# Patient Record
Sex: Male | Born: 1995 | Race: Black or African American | Hispanic: No | Marital: Single
Health system: Midwestern US, Community
[De-identification: ages and names within clinical notes are randomized; demographics above are authoritative.]

## PROBLEM LIST (undated history)

## (undated) DIAGNOSIS — F489 Nonpsychotic mental disorder, unspecified: Secondary | ICD-10-CM

---

## 2015-04-03 ENCOUNTER — Emergency Department (INDEPENDENT_AMBULATORY_CARE_PROVIDER_SITE_OTHER): Payer: Medicaid - Out of State

## 2015-04-03 ENCOUNTER — Emergency Department (INDEPENDENT_AMBULATORY_CARE_PROVIDER_SITE_OTHER)
Admission: EM | Admit: 2015-04-03 | Discharge: 2015-04-03 | Disposition: A | Discharge status: Home / Self Care | Payer: Medicaid - Out of State | Source: Home / Self Care | Attending: Family Medicine | Admitting: Family Medicine

## 2015-04-03 ENCOUNTER — Encounter (HOSPITAL_COMMUNITY): Payer: Self-pay | Admitting: *Deleted

## 2015-04-03 DIAGNOSIS — S82841A Displaced bimalleolar fracture of right lower leg, initial encounter for closed fracture: Secondary | ICD-10-CM

## 2015-04-03 MED ORDER — HYDROCODONE-ACETAMINOPHEN 5-325 MG PO TABS: 1.0000 | ORAL_TABLET | Freq: Four times a day (QID) | ORAL | Status: AC | PRN

## 2015-04-03 NOTE — ED Provider Notes (Signed)
CSN: [id number]     Arrival date & time 04/03/15  1305 History   First MD Initiated Contact with Patient 04/03/15 1344     Chief Complaint  Patient presents with  . Ankle Pain   (Consider location/radiation/quality/duration/timing/severity/associated sxs/prior Treatment) Patient is a 19 y.o. male presenting with ankle pain. The history is provided by the patient.  Ankle Pain Location:  Ankle Time since incident:  1 day Injury: yes   Mechanism of injury comment:  Injured yest playing bball, continues with lat pain and sts. Ankle location:  R ankle Pain details:    Radiates to:  Does not radiate   Severity:  Moderate   Progression:  Unchanged Chronicity:  New Dislocation: no   Foreign body present:  No foreign bodies Prior injury to area:  No Relieved by:  None tried   History reviewed. No pertinent past medical history. History reviewed. No pertinent past surgical history. History reviewed. No pertinent family history. Social History  Substance Use Topics  . Smoking status: Never Smoker   . Smokeless tobacco: None  . Alcohol Use: No    Review of Systems  Musculoskeletal: Positive for joint swelling and gait problem.  Skin: Negative.   All other systems reviewed and are negative.   Allergies  Review of patient's allergies indicates no known allergies.  Home Medications   Prior to Admission medications   Medication Sig Start Date End Date Taking? Authorizing Provider  HYDROcodone-acetaminophen (NORCO/VICODIN) 5-325 MG tablet Take 1 tablet by mouth every 6 (six) hours as needed. For pain 04/03/15   Linna Hoff, MD   Meds Ordered and Administered this Visit  Medications - No data to display  BP 114/78 mmHg  Pulse 67  Temp(Src) 98.3 F (36.8 C) (Oral)  Resp 16  SpO2 99% No data found.   Physical Exam  Constitutional: He is oriented to person, place, and time. He appears well-developed and well-nourished.  Musculoskeletal: He exhibits tenderness.   Right ankle: He exhibits decreased range of motion, swelling and deformity. He exhibits normal pulse. Tenderness. Lateral malleolus, AITFL and CF ligament tenderness found. No medial malleolus, no head of 5th metatarsal and no proximal fibula tenderness found. Achilles tendon normal.  Neurological: He is alert and oriented to person, place, and time.  Skin: Skin is warm and dry.  Nursing note and vitals reviewed.   ED Course  Procedures (including critical care time)  Labs Review Labs Reviewed - No data to display  Imaging Review Dg Ankle Complete Right  04/03/2015   CLINICAL DATA:  Ankle injury 1 day ago.  EXAM: RIGHT ANKLE - COMPLETE 3+ VIEW  COMPARISON:  None.  FINDINGS: There is a comminuted oblique fracture through the medial malleolus with intra-articular extension. Ankle mortise otherwise appears located. Linear calcific fragments are seen within the ankle mortise. There is a calcific fragment within the anterior lateral soft tissues of the ankle, with uncertain donor site. There is extensive soft tissue swelling.  IMPRESSION: Comminuted intra-articular fracture of the medial malleolus.  Linear calcific densities within ankle mortise, and a calcific fragment within the anterior lateral soft tissues with uncertain donor site.   Electronically Signed   By: Ted Mcalpine M.D.   On: 04/03/2015 14:16   X-rays reviewed and report per radiologist.   Visual Acuity Review  Right Eye Distance:   Left Eye Distance:   Bilateral Distance:    Right Eye Near:   Left Eye Near:    Bilateral Near:  MDM   1. Ankle fracture, bimalleolar, closed, right, initial encounter    Discussed with dr Lajoyce Corners, will see 24-48h, ice and elevate    Linna Hoff, MD 04/03/15 1446

## 2015-04-03 NOTE — Discharge Instructions (Signed)
Ice, elevate, use crutches and see dr duda in 1-2 days for recheck., you must keep elevated as much as possible.

## 2015-04-03 NOTE — ED Notes (Signed)
Pt  rports  Pain /   Swelling  To  The  Affected r  Ankle   He  States  He  Injured  It  Playing  [employer]              He  Has  Pain  And  Swelling  Present

## 2015-04-03 NOTE — Progress Notes (Signed)
Orthopedic Tech Progress Note Patient Details:  Dylan Adams January 17, 1996 [id number] Applied fiberglass posterior short leg splint and fiberglass stirrup splint to RLE.  Pulses, sensation motion intact before and after splinting.  Capillary refill less than 2 seconds before and after splinting.  Fit pt. for crutches and taught use of same. Ortho Devices Type of Ortho Device: Stirrup splint, Post (short leg) splint, Crutches Ortho Device/Splint Location: RLE Ortho Device/Splint Interventions: Application   [name] 04/03/2015, 4:11 PM

## 2015-04-05 ENCOUNTER — Other Ambulatory Visit: Payer: Self-pay | Admitting: Plastic Surgery

## 2015-04-06 ENCOUNTER — Other Ambulatory Visit: Payer: Self-pay | Admitting: Pediatrics

## 2015-04-06 DIAGNOSIS — M25571 Pain in right ankle and joints of right foot: Secondary | ICD-10-CM

## 2015-04-07 ENCOUNTER — Ambulatory Visit
Admission: RE | Admit: 2015-04-07 | Discharge: 2015-04-07 | Disposition: A | Discharge status: Home / Self Care | Payer: Medicaid - Out of State | Source: Ambulatory Visit | Attending: Pediatrics | Admitting: Pediatrics

## 2015-04-07 DIAGNOSIS — M25571 Pain in right ankle and joints of right foot: Secondary | ICD-10-CM

## 2015-10-10 ENCOUNTER — Encounter (HOSPITAL_COMMUNITY): Payer: Self-pay | Admitting: Emergency Medicine

## 2015-10-10 ENCOUNTER — Ambulatory Visit (HOSPITAL_COMMUNITY)
Admission: EM | Admit: 2015-10-10 | Discharge: 2015-10-10 | Disposition: A | Discharge status: Home / Self Care | Payer: Managed Care, Other (non HMO) | Attending: Family Medicine | Admitting: Family Medicine

## 2015-10-10 DIAGNOSIS — R69 Illness, unspecified: Principal | ICD-10-CM

## 2015-10-10 DIAGNOSIS — J111 Influenza due to unidentified influenza virus with other respiratory manifestations: Secondary | ICD-10-CM

## 2015-10-10 NOTE — ED Notes (Signed)
[facility] a complaint of a cough, chills, and a headache x 1 week.

## 2015-10-10 NOTE — Discharge Instructions (Signed)
Drink plenty of fluids as discussed, treat aching as needed and mucinex or delsym for cough. Return or see your doctor if further problems

## 2015-10-10 NOTE — ED Provider Notes (Signed)
CSN: [id number]     Arrival date & time 10/10/15  1822 History   First MD Initiated Contact with Patient 10/10/15 1934     Chief Complaint  Patient presents with  . Headache  . Chills  . Cough   (Consider location/radiation/quality/duration/timing/severity/associated sxs/prior Treatment) Patient is a 20 y.o. male presenting with headaches and cough. The history is provided by the patient.  Headache Pain location:  Generalized Quality:  Dull Radiates to:  Does not radiate Onset quality:  Gradual Duration:  1 week Progression:  Unchanged Chronicity:  New Similar to prior headaches: no   Relieved by:  None tried Associated symptoms: cough, drainage, myalgias and URI   Associated symptoms: no abdominal pain, no back pain, no fever, no nausea, no sore throat, no swollen glands and no vomiting   Cough Associated symptoms: headaches and myalgias   Associated symptoms: no fever and no sore throat     History reviewed. No pertinent past medical history. History reviewed. No pertinent past surgical history. History reviewed. No pertinent family history. Social History  Substance Use Topics  . Smoking status: Never Smoker   . Smokeless tobacco: None  . Alcohol Use: No    Review of Systems  Constitutional: Negative.  Negative for fever.  HENT: Positive for postnasal drip. Negative for sore throat.   Respiratory: Positive for cough.   Cardiovascular: Negative.   Gastrointestinal: Negative.  Negative for nausea, vomiting and abdominal pain.  Genitourinary: Negative.   Musculoskeletal: Positive for myalgias. Negative for back pain.  Neurological: Positive for headaches.  All other systems reviewed and are negative.   Allergies  Review of patient's allergies indicates no known allergies.  Home Medications   Prior to Admission medications   Medication Sig Start Date End Date Taking? Authorizing Provider  HYDROcodone-acetaminophen (NORCO/VICODIN) 5-325 MG tablet Take 1 tablet  by mouth every 6 (six) hours as needed. For pain 04/03/15   Linna HoffJames D Asriel Westrup, MD   Meds Ordered and Administered this Visit  Medications - No data to display  BP 119/79 mmHg  Pulse 75  Temp(Src) 98.2 F (36.8 C) (Oral)  SpO2 98% No data found.   Physical Exam  Constitutional: He is oriented to person, place, and time. He appears well-developed and well-nourished.  HENT:  Right Ear: External ear normal.  Left Ear: External ear normal.  Mouth/Throat: Oropharynx is clear and moist.  Eyes: Pupils are equal, round, and reactive to light.  Neck: Normal range of motion. Neck supple.  Cardiovascular: Normal rate, normal heart sounds and intact distal pulses.   Pulmonary/Chest: Effort normal.  Abdominal: Soft. Bowel sounds are normal. There is no tenderness.  Lymphadenopathy:    He has no cervical adenopathy.  Neurological: He is alert and oriented to person, place, and time.  Skin: Skin is warm and dry.  Nursing note and vitals reviewed.   ED Course  Procedures (including critical care time)  Labs Review Labs Reviewed - No data to display  Imaging Review No results found.   Visual Acuity Review  Right Eye Distance:   Left Eye Distance:   Bilateral Distance:    Right Eye Near:   Left Eye Near:    Bilateral Near:         MDM  No diagnosis found.    Linna HoffJames D Davan Hark, MD 10/10/15 2004

## 2016-06-17 ENCOUNTER — Telehealth (INDEPENDENT_AMBULATORY_CARE_PROVIDER_SITE_OTHER): Payer: Self-pay | Admitting: Orthopaedic Surgery

## 2016-06-18 ENCOUNTER — Ambulatory Visit (INDEPENDENT_AMBULATORY_CARE_PROVIDER_SITE_OTHER): Payer: Self-pay | Admitting: Orthopaedic Surgery

## 2016-06-18 NOTE — Telephone Encounter (Signed)
Patient called again today asking about a refill of oxycodone, he is having ankle pain.  He was last seen 07/2015.  I advised him I did not know if a refill could be given as he was last seen so long ago.  He called yesterday and s/w Kathie RhodesBetty.  Pls call pt at [phone], thanks.

## 2016-06-19 NOTE — Telephone Encounter (Signed)
Could you please call and tell him he needs to make an appointment, he was a no show on Tuesday 06/18/16. Thanks!!

## 2016-06-19 NOTE — Telephone Encounter (Signed)
Please advise 

## 2016-06-21 NOTE — Telephone Encounter (Signed)
Patient is now living in [place] and will see a physician there.

## 2016-08-15 ENCOUNTER — Inpatient Hospital Stay
Admit: 2016-08-15 | Discharge: 2016-08-15 | Disposition: A | Discharge status: Home / Self Care | Payer: PRIVATE HEALTH INSURANCE | Attending: Emergency Medicine

## 2016-08-15 DIAGNOSIS — Z0389 Encounter for observation for other suspected diseases and conditions ruled out: Secondary | ICD-10-CM

## 2016-08-15 LAB — METABOLIC PANEL, BASIC
Anion gap: 4 mmol/L (ref 3.0–18)
BUN/Creatinine ratio: 14 (ref 12–20)
BUN: 13 MG/DL (ref 7.0–18)
CO2: 31 mmol/L (ref 21–32)
Calcium: 9.7 MG/DL (ref 8.5–10.1)
Chloride: 102 mmol/L (ref 100–108)
Creatinine: 0.96 MG/DL (ref 0.6–1.3)
GFR est AA: >60 mL/min/{1.73_m2} (ref >60)
GFR est non-AA: >60 mL/min/{1.73_m2} (ref >60)
Glucose: 89 mg/dL (ref 74–99)
Potassium: 3.8 mmol/L (ref 3.5–5.5)
Sodium: 137 mmol/L (ref 136–145)

## 2016-08-15 LAB — CBC WITH AUTOMATED DIFF
ABS. BASOPHILS: 0 10*3/uL (ref 0.0–0.1)
ABS. EOSINOPHILS: 0.1 10*3/uL (ref 0.0–0.4)
ABS. LYMPHOCYTES: 2.6 10*3/uL (ref 0.9–3.6)
ABS. MONOCYTES: 0.5 10*3/uL (ref 0.05–1.2)
ABS. NEUTROPHILS: 2.8 10*3/uL (ref 1.8–8.0)
BASOPHILS: 0 % (ref 0–2)
EOSINOPHILS: 1 % (ref 0–5)
HCT: 45.9 % (ref 36.0–48.0)
HGB: 16.3 g/dL — ABNORMAL HIGH (ref 13.0–16.0)
LYMPHOCYTES: 44 % (ref 21–52)
MCH: 29.9 PG (ref 24.0–34.0)
MCHC: 35.5 g/dL (ref 31.0–37.0)
MCV: 84.2 FL (ref 74.0–97.0)
MONOCYTES: 8 % (ref 3–10)
MPV: 9.9 FL (ref 9.2–11.8)
NEUTROPHILS: 47 % (ref 40–73)
PLATELET: 210 10*3/uL (ref 135–420)
RBC: 5.45 M/uL (ref 4.70–5.50)
RDW: 12.3 % (ref 11.6–14.5)
WBC: 6 10*3/uL (ref 4.6–13.2)

## 2016-08-15 LAB — DRUG SCREEN, URINE
AMPHETAMINES: NEGATIVE
BARBITURATES: NEGATIVE
BENZODIAZEPINES: NEGATIVE
COCAINE: NEGATIVE
METHADONE: NEGATIVE
OPIATES: NEGATIVE
PCP(PHENCYCLIDINE): NEGATIVE
THC (TH-CANNABINOL): POSITIVE — AB

## 2016-08-15 LAB — TSH 3RD GENERATION: TSH: 1.67 u[IU]/mL (ref 0.36–3.74)

## 2016-08-15 LAB — ETHYL ALCOHOL: ALCOHOL(ETHYL),SERUM: <3 MG/DL (ref 0–3)

## 2016-08-15 NOTE — ED Triage Notes (Addendum)
Pt states was sent here by PCP for mental health evaluation currently denies SI and HI cooperative with ED staff

## 2016-08-15 NOTE — ED Provider Notes (Signed)
EMERGENCY DEPARTMENT HISTORY AND PHYSICAL EXAM    2:33 PM      Date: 08/15/2016  Harry Nelson Name: [facility]    History of Presenting Illness     Chief Complaint   Harry Nelson presents with   ??? Mental Health Problem         History Provided By: Harry Nelson and Patients doctor    Chief Complaint: Mental health evaluation  Duration:  Prior to arrival  Timing:  Acute  Location: N/A  Quality: N/A  Severity: Moderate  Modifying Factors: N/A  Associated Symptoms: denies any other associated signs or symptoms      Additional History (Context): Harry Nelson is a 21 y.o. male who presents to ED for moderate acute mental health evaluation per phone call received from patients doctor prior to arrival. Per PCP, Harry Nelson stated that he felt like someone was watching him and that it was a chip in his ankle in which the government uses to monitor him. Denies any sx or saying the above stated at the doctors visit. States he's here for blood work but doesn't know why.      As the Harry Nelson is without physical symptoms or complaints of pain, there is no severity of pain, quality of pain, duration, modifying factors, or associated signs and symptoms regarding the pt's presenting complaint.    PCP: Rosealee Albee, MD        Past History     Past Medical History:  History reviewed. No pertinent past medical history.    Past Surgical History:  History reviewed. No pertinent surgical history.    Family History:  History reviewed. No pertinent family history.    Social History:  Social History   Substance Use Topics   ??? Smoking status: Never Smoker   ??? Smokeless tobacco: Never Used   ??? Alcohol use None       Allergies:  No Known Allergies      Review of Systems       Review of Systems   Constitutional: Negative for chills and fever.   HENT: Negative for rhinorrhea.    Cardiovascular: Negative for chest pain.   Gastrointestinal: Negative for abdominal pain, nausea and vomiting.   Endocrine: Negative for polyuria.    Genitourinary: Negative for dysuria.   Musculoskeletal: Negative for back pain.   Skin: Negative for rash.   Neurological: Positive for syncope. Negative for headaches.   Psychiatric/Behavioral: Negative for hallucinations.   All other systems reviewed and are negative.        Physical Exam     Harry Nelson Vitals for the past 12 hrs:   Temp Pulse Resp BP SpO2   08/15/16 1449 - - - - 99 %   08/15/16 1331 98 ??F (36.7 ??C) 95 18 (!) 140/91 99 %         Physical Exam   Constitutional: He is oriented to person, place, and time. He appears well-developed and well-nourished.   Speaking in full sentences   HENT:   Head: Normocephalic and atraumatic.   Eyes: Conjunctivae are normal. Pupils are equal, round, and reactive to light.   Neck: Normal range of motion. Neck supple.   Cardiovascular: Normal rate and regular rhythm.    Pulmonary/Chest: Effort normal and breath sounds normal. No respiratory distress. He has no wheezes.   Abdominal: Soft. Bowel sounds are normal. He exhibits no distension. There is no tenderness. There is no rebound and no guarding.   Musculoskeletal: Normal range of motion.   Neurological:  He is alert and oriented to person, place, and time.   Skin: Skin is warm and dry.   Psychiatric: He has a normal mood and affect. Thought content normal.   Nursing note and vitals reviewed.        Diagnostic Study Results     Labs -  Recent Results (from the past 12 hour(s))   DRUG SCREEN, URINE    Collection Time: 08/15/16  1:39 PM   Result Value Ref Range    BENZODIAZEPINES NEGATIVE  NEG      BARBITURATES NEGATIVE  NEG      THC (TH-CANNABINOL) POSITIVE (A) NEG      OPIATES NEGATIVE  NEG      PCP(PHENCYCLIDINE) NEGATIVE  NEG      COCAINE NEGATIVE  NEG      AMPHETAMINES NEGATIVE  NEG      METHADONE NEGATIVE  NEG      HDSCOM (NOTE)    CBC WITH AUTOMATED DIFF    Collection Time: 08/15/16  2:46 PM   Result Value Ref Range    WBC 6.0 4.6 - 13.2 K/uL    RBC 5.45 4.70 - 5.50 M/uL    HGB 16.3 (H) 13.0 - 16.0 g/dL     HCT 16.145.9 09.636.0 - 04.548.0 %    MCV 84.2 74.0 - 97.0 FL    MCH 29.9 24.0 - 34.0 PG    MCHC 35.5 31.0 - 37.0 g/dL    RDW 40.912.3 81.111.6 - 91.414.5 %    PLATELET 210 135 - 420 K/uL    MPV 9.9 9.2 - 11.8 FL    NEUTROPHILS 47 40 - 73 %    LYMPHOCYTES 44 21 - 52 %    MONOCYTES 8 3 - 10 %    EOSINOPHILS 1 0 - 5 %    BASOPHILS 0 0 - 2 %    ABS. NEUTROPHILS 2.8 1.8 - 8.0 K/UL    ABS. LYMPHOCYTES 2.6 0.9 - 3.6 K/UL    ABS. MONOCYTES 0.5 0.05 - 1.2 K/UL    ABS. EOSINOPHILS 0.1 0.0 - 0.4 K/UL    ABS. BASOPHILS 0.0 0.0 - 0.1 K/UL    DF AUTOMATED     METABOLIC PANEL, BASIC    Collection Time: 08/15/16  2:46 PM   Result Value Ref Range    Sodium 137 136 - 145 mmol/L    Potassium 3.8 3.5 - 5.5 mmol/L    Chloride 102 100 - 108 mmol/L    CO2 31 21 - 32 mmol/L    Anion gap 4 3.0 - 18 mmol/L    Glucose 89 74 - 99 mg/dL    BUN 13 7.0 - 18 MG/DL    Creatinine 7.820.96 0.6 - 1.3 MG/DL    BUN/Creatinine ratio 14 12 - 20      GFR est AA >60 >60 ml/min/1.6373m2    GFR est non-AA >60 >60 ml/min/1.7473m2    Calcium 9.7 8.5 - 10.1 MG/DL   ETHYL ALCOHOL    Collection Time: 08/15/16  2:46 PM   Result Value Ref Range    ALCOHOL(ETHYL),SERUM <3 0 - 3 MG/DL   TSH 3RD GENERATION    Collection Time: 08/15/16  2:46 PM   Result Value Ref Range    TSH 1.67 0.36 - 3.74 uIU/mL       Radiologic Studies -   No results found.      Medical Decision Making   I am the first provider for this Harry Nelson.    I  reviewed the vital signs, available nursing notes, past medical history, past surgical history, family history and social history.    Vital Signs-Reviewed the Harry Nelson's vital signs.    Pulse Oximetry Analysis -  99% on room air , Normal      Records Reviewed: Nursing Notes and Old Medical Records (Time of Review: 2:33 PM)    Provider Notes (Medical Decision Making): Harry Nelson is denying he made any statements at the PCP office. Discussed with crisis and Harry Nelson doesn't not meet admission critieria if he is currently denying everything and has  no SI or HI which he doesn't. Discussed with PCPwho will follow-up this week.     ED Course: Progress Notes, Reevaluation, and Consults:  4:19 PM  Talked to crisis about the Harry Nelson. Harry Nelson is asking to leave, Crisis says that if he is currently denying anything that they previously reported at the office then he is free to go.     Consult:  Discussed care with associate Dr. Helen Hashimoto, Specialty: M.D. Standard discussion; including history of Harry Nelson???s chief complaint, available diagnostic results, and treatment course. Crisis states that if he denied everything then they agreed that there is nothing that we can do and they will follow up with Harry Nelson this week  5:19 PM, 08/15/2016       Diagnosis     Clinical Impression:   1. Normal physical exam        Disposition: Discharge    Follow-up Information     Follow up With Details Comments Contact Info    Rosealee Albee, MD In 1 day  [address]  [place] [address]  [phone]             There are no discharge medications for this Harry Nelson.    _______________________________    Attestations:  Scribe Attestation     Berneta Sages and Mable Fill acting as a scribe for and in the presence of Winn Jock, MD      August 15, 2016 at 2:33 PM       Provider Attestation:      I personally performed the services described in the documentation, reviewed the documentation, as recorded by the scribe in my presence, and it accurately and completely records my words and actions. August 15, 2016 at 2:33 PM - Winn Jock, MD    _______________________________

## 2016-08-15 NOTE — ED Notes (Signed)
Patient states he needs to go home to check on his grandmother. MD notified.

## 2016-08-15 NOTE — ED Notes (Cosign Needed)
I performed a brief evaluation, including history and physical, of the patient here in triage and I have determined that pt will need further treatment and evaluation from the main side ER physician.  I have placed initial orders to help in expediting patients care.     August 15, 2016 at 1:37 PM - Clayton BiblesSunny A Torrance Frech, PA-C        Visit Vitals   ??? Pulse 95   ??? Temp 98 ??F (36.7 ??C)   ??? Resp 18   ??? SpO2 99%

## 2016-08-15 NOTE — ED Notes (Signed)
Patient was given d/c papers and reassured to return for evaluation.

## 2016-08-15 NOTE — ED Notes (Signed)
Patient is resting in bed. He has no requests at this time.

## 2016-08-17 ENCOUNTER — Inpatient Hospital Stay
Admit: 2016-08-17 | Discharge: 2016-08-23 | Disposition: A | Discharge status: Home / Self Care | Payer: PRIVATE HEALTH INSURANCE | Attending: Psychiatry | Admitting: Psychiatry

## 2016-08-17 DIAGNOSIS — F12159 Cannabis abuse with psychotic disorder, unspecified: Secondary | ICD-10-CM

## 2016-08-17 LAB — METABOLIC PANEL, COMPREHENSIVE
A-G Ratio: 1 (ref 0.8–1.7)
ALT (SGPT): 21 U/L (ref 16–61)
AST (SGOT): 21 U/L (ref 15–37)
Albumin: 4.3 g/dL (ref 3.4–5.0)
Alk. phosphatase: 115 U/L (ref 45–117)
Anion gap: 7 mmol/L (ref 3.0–18)
BUN/Creatinine ratio: 17 (ref 12–20)
BUN: 18 MG/DL (ref 7.0–18)
Bilirubin, total: 0.6 MG/DL (ref 0.2–1.0)
CO2: 27 mmol/L (ref 21–32)
Calcium: 9.3 MG/DL (ref 8.5–10.1)
Chloride: 105 mmol/L (ref 100–108)
Creatinine: 1.05 MG/DL (ref 0.6–1.3)
GFR est AA: >60 mL/min/{1.73_m2} (ref >60)
GFR est non-AA: >60 mL/min/{1.73_m2} (ref >60)
Globulin: 4.1 g/dL — ABNORMAL HIGH (ref 2.0–4.0)
Glucose: 93 mg/dL (ref 74–99)
Potassium: 3.6 mmol/L (ref 3.5–5.5)
Protein, total: 8.4 g/dL — ABNORMAL HIGH (ref 6.4–8.2)
Sodium: 139 mmol/L (ref 136–145)

## 2016-08-17 LAB — CBC WITH AUTOMATED DIFF
ABS. BASOPHILS: 0 10*3/uL (ref 0.0–0.1)
ABS. EOSINOPHILS: 0.1 10*3/uL (ref 0.0–0.4)
ABS. LYMPHOCYTES: 2.8 10*3/uL (ref 0.9–3.6)
ABS. MONOCYTES: 0.7 10*3/uL (ref 0.05–1.2)
ABS. NEUTROPHILS: 3.6 10*3/uL (ref 1.8–8.0)
BASOPHILS: 0 % (ref 0–2)
EOSINOPHILS: 1 % (ref 0–5)
HCT: 44.3 % (ref 36.0–48.0)
HGB: 15.5 g/dL (ref 13.0–16.0)
LYMPHOCYTES: 39 % (ref 21–52)
MCH: 29.4 PG (ref 24.0–34.0)
MCHC: 35 g/dL (ref 31.0–37.0)
MCV: 83.9 FL (ref 74.0–97.0)
MONOCYTES: 10 % (ref 3–10)
MPV: 10.2 FL (ref 9.2–11.8)
NEUTROPHILS: 50 % (ref 40–73)
PLATELET: 224 10*3/uL (ref 135–420)
RBC: 5.28 M/uL (ref 4.70–5.50)
RDW: 12.4 % (ref 11.6–14.5)
WBC: 7.2 10*3/uL (ref 4.6–13.2)

## 2016-08-17 LAB — DRUG SCREEN, URINE
AMPHETAMINES: NEGATIVE
BARBITURATES: NEGATIVE
BENZODIAZEPINES: NEGATIVE
COCAINE: NEGATIVE
METHADONE: NEGATIVE
OPIATES: NEGATIVE
PCP(PHENCYCLIDINE): NEGATIVE
THC (TH-CANNABINOL): POSITIVE — AB

## 2016-08-17 LAB — ETHYL ALCOHOL: ALCOHOL(ETHYL),SERUM: <3 MG/DL (ref 0–3)

## 2016-08-17 NOTE — ED Notes (Signed)
Report called to Tammise RN on behavioral unit. Will escort patient shortly.

## 2016-08-17 NOTE — ED Triage Notes (Signed)
Patient is here to get help  With drinking and drugs. Patient's uncle brought him here. Patient states he smokes mariajuana denies drinking alcohol.

## 2016-08-17 NOTE — ED Notes (Signed)
Crisis worker at bedside

## 2016-08-17 NOTE — ED Notes (Signed)
Awaiting bed placement. No ss distress. Family at bedside. Will continue to monitor closely while awaiting further orders.

## 2016-08-17 NOTE — ED Notes (Signed)
Report given to Jennifer RN. No questions or concerns after report

## 2016-08-17 NOTE — ED Provider Notes (Signed)
EMERGENCY DEPARTMENT HISTORY AND PHYSICAL EXAM    2:20 PM      Date: 08/17/2016  Patient Name: Harry Nelson    History of Presenting Illness     Chief Complaint   Patient presents with   ??? Mental Health Problem     drugs and alcohol         History Provided By: Patient, Patient's Father and Patient's uncle    Chief Complaint: Mental health  Duration:  1 day ago  Timing:  Acute  Severity: Severe  Modifying Factors: pt denies drinking or taking anything more than weed but his uncle and father believe he did more than that.  Associated Symptoms: Denies HI, SI, and auditory halucinations      Additional History (Context): Harry Nelson is a 21 y.o. male with No significant past medical history who presents with severe acute mental health problems onset 1 day ago.  Pt denies drinking or taking anything more than weed but his uncle and father believe he did more than that. Pt has be exibiting abnormal behavior and paranoia. Denies HI, SI, and auditory hallucinations. Pt has a shx of alcohol use but no shx of tobacco use.    PCP: Rufina Falco, MD        Past History     Past Medical History:  No past medical history on file.    Past Surgical History:  No past surgical history on file.    Family History:  No family history on file.    Social History:  Social History   Substance Use Topics   ??? Smoking status: Never Smoker   ??? Smokeless tobacco: Never Used   ??? Alcohol use Not on file       Allergies:  No Known Allergies      Review of Systems   Review of Systems   Constitutional: Negative for fever.   Psychiatric/Behavioral: Negative for hallucinations, self-injury and suicidal ideas.   All other systems reviewed and are negative.        Physical Exam     Visit Vitals   ??? BP 127/79 (BP 1 Location: Left arm, BP Patient Position: At rest)   ??? Pulse 86   ??? Temp 98.2 ??F (36.8 ??C)   ??? Resp 17   ??? SpO2 100%         Physical Exam   Constitutional: He is oriented to person, place, and time. He appears  well-developed and well-nourished.   HENT:   Head: Normocephalic and atraumatic.   Neck: Neck supple. No JVD present.   Cardiovascular: Normal rate and regular rhythm.    Pulmonary/Chest: Effort normal and breath sounds normal. No respiratory distress.   Abdominal: Soft. He exhibits no distension. There is no tenderness. There is no rebound and no guarding.   Musculoskeletal: He exhibits no edema.   Neurological: He is alert and oriented to person, place, and time.   Skin: Skin is warm and dry. No erythema.   Psychiatric:   Flat affect  Poor eye contact  Denies SI, HI or hallucinations         Diagnostic Study Results     Labs -  Recent Results (from the past 12 hour(s))   DRUG SCREEN, URINE    Collection Time: 08/17/16  1:31 PM   Result Value Ref Range    BENZODIAZEPINES NEGATIVE  NEG      BARBITURATES NEGATIVE  NEG      THC (TH-CANNABINOL) POSITIVE (A) NEG  OPIATES NEGATIVE  NEG      PCP(PHENCYCLIDINE) NEGATIVE  NEG      COCAINE NEGATIVE  NEG      AMPHETAMINES NEGATIVE  NEG      METHADONE NEGATIVE  NEG      HDSCOM (NOTE)    ETHYL ALCOHOL    Collection Time: 08/17/16  1:38 PM   Result Value Ref Range    ALCOHOL(ETHYL),SERUM <3 0 - 3 MG/DL   CBC WITH AUTOMATED DIFF    Collection Time: 08/17/16  1:38 PM   Result Value Ref Range    WBC 7.2 4.6 - 13.2 K/uL    RBC 5.28 4.70 - 5.50 M/uL    HGB 15.5 13.0 - 16.0 g/dL    HCT 44.3 36.0 - 48.0 %    MCV 83.9 74.0 - 97.0 FL    MCH 29.4 24.0 - 34.0 PG    MCHC 35.0 31.0 - 37.0 g/dL    RDW 12.4 11.6 - 14.5 %    PLATELET 224 135 - 420 K/uL    MPV 10.2 9.2 - 11.8 FL    NEUTROPHILS 50 40 - 73 %    LYMPHOCYTES 39 21 - 52 %    MONOCYTES 10 3 - 10 %    EOSINOPHILS 1 0 - 5 %    BASOPHILS 0 0 - 2 %    ABS. NEUTROPHILS 3.6 1.8 - 8.0 K/UL    ABS. LYMPHOCYTES 2.8 0.9 - 3.6 K/UL    ABS. MONOCYTES 0.7 0.05 - 1.2 K/UL    ABS. EOSINOPHILS 0.1 0.0 - 0.4 K/UL    ABS. BASOPHILS 0.0 0.0 - 0.1 K/UL    DF AUTOMATED     METABOLIC PANEL, COMPREHENSIVE    Collection Time: 08/17/16  1:38 PM    Result Value Ref Range    Sodium 139 136 - 145 mmol/L    Potassium 3.6 3.5 - 5.5 mmol/L    Chloride 105 100 - 108 mmol/L    CO2 27 21 - 32 mmol/L    Anion gap 7 3.0 - 18 mmol/L    Glucose 93 74 - 99 mg/dL    BUN 18 7.0 - 18 MG/DL    Creatinine 1.05 0.6 - 1.3 MG/DL    BUN/Creatinine ratio 17 12 - 20      GFR est AA >60 >60 ml/min/1.59m    GFR est non-AA >60 >60 ml/min/1.754m   Calcium 9.3 8.5 - 10.1 MG/DL    Bilirubin, total 0.6 0.2 - 1.0 MG/DL    ALT (SGPT) 21 16 - 61 U/L    AST (SGOT) 21 15 - 37 U/L    Alk. phosphatase 115 45 - 117 U/L    Protein, total 8.4 (H) 6.4 - 8.2 g/dL    Albumin 4.3 3.4 - 5.0 g/dL    Globulin 4.1 (H) 2.0 - 4.0 g/dL    A-G Ratio 1.0 0.8 - 1.7         Radiologic Studies -   No orders to display         Medical Decision Making   I am the first provider for this patient.    I reviewed the vital signs, available nursing notes, past medical history, past surgical history, family history and social history.    Vital Signs-Reviewed the patient's vital signs.    Records Reviewed: Nursing Notes (Time of Review: 2:20 PM)    ED Course: Progress Notes, Reevaluation, and Consults:  3:05 PM Consult: I discussed care with DoButch Penny  Crisis).  It was a standard discussion including patient history, chief complaint, available diagnostic results, and predicted treatment course.  Will eval pt.    5:59 PM  Pt seen by Butch Penny, she will plan on admission, pt is currently voluntary.     7 PM : Pt care transferred to Dr. Gunnar Fusi  ,ED provider. History of patient complaint(s), available diagnostic reports and current treatment plan has been discussed thoroughly.   Bedside rounding on patient occured : yes .  Intended disposition of patient : ADMIT  Pending diagnostics reports and/or labs (please list):           Provider Notes (Medical Decision Making): 21 y/o male presents for psych eval. Pt with paranoia. Recent visit, will consult crisis. Denies SI or HI.       Diagnosis     Clinical Impression:    1. Paranoia ([place])        Disposition:     Follow-up Information     None           Patient's Medications    No medications on file     _______________________________    Attestations:  Scribe [place] acting as a scribe for and in the presence of Lamonte Sakai, DO      August 17, 2016 at 2:20 PM       Provider Attestation:      I personally performed the services described in the documentation, reviewed the documentation, as recorded by the scribe in my presence, and it accurately and completely records my words and actions. August 17, 2016 at 2:20 PM - Lamonte Sakai, DO    _______________________________

## 2016-08-17 NOTE — ED Notes (Addendum)
Pt's father and uncle reports behavioral changes (paronoid), thought to  Be using xanax and ETOH.

## 2016-08-17 NOTE — Behavioral Health Treatment Team (Signed)
Patient arrived on unit anxious watchful. Patient provided minimal verbal responses and was guarded throughout nursing assessment. Patient denies medical history, Patient denies substance abuse other than Marijuana with UDSC (+). Patient was admitted due to parent's stating patient is acting odd and out of sorts. Patient refused meal and requested [place] to bed upon completion of nursing assessment. Patient denies auditory hallucinations, denies suicidal ideation. Patient has no previous admissions or mental health treatments.

## 2016-08-17 NOTE — Other (Signed)
21 yo M seen in ED room 9 at the request of Dr.Schmidt. Alert & O x 4, slowed responses at times, mild thought blocking and possible confusion. Appears distracted at times, denies A / V hallucinations. Client wants father and grandmother to remain at bedside for entire evaluation. They are both kindly but firmly contradicting most of client's responses and he is then in agreement with them. For example, he states his grades are fine and he attends [employer] studying business administration. He has had falling grades and an attendance issue recently. Memory selective and possibly impaired, per family judgement is poor and not himself. Insight poor.  Has been living with grandmother, will be returning to live with father. Second ED visit in past 48 hours for escalation of same. Also saw his PCP this week and was referred for emergency psychiatric care.    CC: bizarre behavior & paranoid delusions    Admits to smoking marijuana. Denies any other drug or synthetic "spice" or other chemical use. UDSC + THC, BAL = negative. Last THC use was this morning that client initially described as a "long time ago." Denies suicidal or homicidal ideation. Family reports concern that he has alluded to self-harm statements and has become very increased in his delusional & paranoid statements. Believes he had microchips implanted 2 years ago when he had surgery for a FX leg/ankle and had screws placed. Believes people are spying on him, watching him and listening to him. Believes someone may be trying to harm him. Not sleeping, calling relatives in the early morning hours, very unusual for him.  Client agrees to voluntary inpatient tx if his father agrees. Father encouraging son to seek help.    DISPOSITION: Discussed with Dr.Schmidt. Dr.Reinold contacted and admission orders received. Report to Tammise, Charity fundraiserN.

## 2016-08-17 NOTE — ED Notes (Signed)
Attempted to call report. Receiving nurse not available at this time. Patient provided paper scrubs and patient property bag for clothing. Will attempt to call again shortly.

## 2016-08-17 NOTE — ED Notes (Signed)
Assumed care of patient. No ss distress. Crackers and ginger ale provided. Uncle at bedside. Will continue to monitor closely while awaiting further orders.

## 2016-08-17 NOTE — ED Notes (Signed)
Awaiting crisis. Multiple family members at bedside. No ss distress. Will continue to monitor closely.

## 2016-08-17 NOTE — ED Notes (Signed)
Patient received dinner tray.

## 2016-08-17 NOTE — ED Notes (Signed)
I performed a brief evaluation, including history and physical, of the patient here in triage and I have determined that pt will need further treatment and evaluation from the main side ER physician.  I have placed initial orders to help in expediting patients care.  Patient brought by family member for "drug and alcohol problem".  Patient states he doesn't really drink ETOh and he only uses THC.   PAtient's family member states that is not true. Patient appears paranoid.     August 17, 2016 at 1:27 PM - Gifford ShaveElizabeth M Reign Dziuba, [place]

## 2016-08-18 MED ORDER — HYDROXYZINE PAMOATE 50 MG CAP
50 mg | ORAL | Status: DC | PRN
Start: 2016-08-18 — End: 2016-08-23

## 2016-08-18 MED ORDER — HALOPERIDOL 5 MG TAB
5 mg | ORAL | Status: DC | PRN
Start: 2016-08-18 — End: 2016-08-23

## 2016-08-18 MED ORDER — BENZTROPINE 1 MG/ML IJ SOLN
1 mg/mL | INTRAMUSCULAR | Status: DC | PRN
Start: 2016-08-18 — End: 2016-08-23

## 2016-08-18 MED ORDER — BENZTROPINE 1 MG TAB
1 mg | ORAL | Status: DC | PRN
Start: 2016-08-18 — End: 2016-08-23

## 2016-08-18 MED ORDER — IBUPROFEN 600 MG TAB
600 mg | Freq: Four times a day (QID) | ORAL | Status: DC | PRN
Start: 2016-08-18 — End: 2016-08-23

## 2016-08-18 MED ORDER — HALOPERIDOL LACTATE 5 MG/ML IJ SOLN
5 mg/mL | INTRAMUSCULAR | Status: DC | PRN
Start: 2016-08-18 — End: 2016-08-23

## 2016-08-18 MED ORDER — LORAZEPAM 2 MG/ML IJ SOLN
2 mg/mL | INTRAMUSCULAR | Status: DC | PRN
Start: 2016-08-18 — End: 2016-08-23

## 2016-08-18 MED ORDER — TRAZODONE 50 MG TAB
50 mg | Freq: Every evening | ORAL | Status: DC | PRN
Start: 2016-08-18 — End: 2016-08-23

## 2016-08-18 MED ORDER — LORAZEPAM 1 MG TAB
1 mg | ORAL | Status: DC | PRN
Start: 2016-08-18 — End: 2016-08-23

## 2016-08-18 NOTE — Behavioral Health Treatment Team (Signed)
GROUP THERAPY PROGRESS NOTE    Renaud Banbury is participating in Leisure-Creative Group.     Group time: 30 minutes    Personal goal for participation: De-stress    Goal orientation: relaxation    Group therapy participation: active    Therapeutic interventions reviewed and discussed: Patient listened to music for relaxation    Impression of participation:calm

## 2016-08-18 NOTE — Behavioral Health Treatment Team (Signed)
GROUP THERAPY PROGRESS NOTE    Harry Nelson is participating in Nursing Education group (Medication).     Group time:  30 minutes    Personal goal for participation: Hygiene care, Sharing life Experiences.     Goal orientation: community    Group therapy participation: active    Therapeutic interventions reviewed and discussed: Medication education, Hygiene care     Impression of participation: Active with participation in group.

## 2016-08-18 NOTE — Progress Notes (Addendum)
Problem: Psychosis  Goal: *STG: Decreased hallucinations  Patient will verbalize denial of auditory/visual hallucinations every shift throughout hospital stay.    Outcome: Progressing Towards Goal  Patient has denied auditory hallucinations.   Goal: *STG: Remains safe in hospital  Patient will remain free of harm to self and others every shift throughout hospitalization.   Outcome: Progressing Towards Goal  Patient hs remained free of harm to self and others while in facility.  Goal: *STG/LTG: Complies with medication therapy  Patient will take medication as prescribed every shift throughout hospital stay.    Outcome: Progressing Towards Goal  Patient has been compliant with prescribed medications.     Comments:Patient's father is requesting a call from MD if possible. Patient father called with contact information to include: [phone]. Patient will reside with father upon discharge. Patient was previously living with grandmother. Patient was previously attending [address] [place] Patient is currently attending attending [employer] after having to transfer due to a drop in his grades.  Patient has been cooperative and pleasant upon approach. Patient has been social and interactive within milieu with staff. Patient reports feeling "better today It's a good day". Patient denies auditory hallucinations. Denies suicidal ideation with no safety issues noted.

## 2016-08-18 NOTE — Behavioral Health Treatment Team (Signed)
Patient's father works for the [employer] and is concerned regarding patient's change in behavior. Patient's mother also works in the Animal nutritionistmental health field. Patient was previously attending [address] [place] Patient is currently attending [employer] after having to transfer due to a drop in his grades. Patient has not been attending school and spends the day calling father and going over to father's home. Patient father called with contact information to include: [phone]. Patient will reside with father upon discharge. Patient was previously living with grandmother. Patient was previously attending [address] [place] Patient is currently attending [employer] after having to transfer due to a drop in his grades.

## 2016-08-18 NOTE — Behavioral Health Treatment Team (Signed)
GROUP THERAPY PROGRESS NOTE    Samael Torti is participating in Recreational Therapy.     Group time: 30 minutes    Personal goal for participation: Exercise/Relaxation/Fresh Air Break    Goal orientation: relaxation    Group therapy participation: active    Therapeutic interventions reviewed and discussed: Exercise/Relaxation/Fresh Air Break    Impression of participation: Pt was anxious to go outside; continuously asked staff. Pt played basketball with peers/staff the entire Recreational Therapy period;appeared to be really enjoy the  Game and fresh air.

## 2016-08-18 NOTE — H&P (Signed)
History and Physical        Patient: Harry Nelson               Sex: male          DOA: 08/17/2016         Date of Birth:  June 09, 1996      Age:  21 y.o.        LOS:  LOS: 1 day        HPI:     Harry Nelson is a 21 y.o. male who was admitted experiencing abnormal thinking and behavior.    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 No pertinent past medical history.    History reviewed. No pertinent surgical history.    Family History   Problem Relation Age of Onset   ??? No Known Problems Mother    ??? No Known Problems Father    ??? No Known Problems Sister    ??? No Known Problems Brother    ??? No Known Problems Maternal Aunt    ??? No Known Problems Sister        Social History     Social History   ??? Marital status: SINGLE     Spouse name: N/A   ??? Number of children: NONE   ??? Years of education: Archivist     Social History Main Topics   ??? Smoking status: Never Smoker   ??? Smokeless tobacco: Never Used   ??? Alcohol use No   ??? Drug use: No   ??? Sexual activity: Not Currently     Other Topics Concern   ??? None     Social History Narrative   Lives with his grandmother. States appetite and sleep have been okay.    Prior to Admission medications    Not on File       No Known Allergies    Review of Systems  A comprehensive review of systems was negative.      Physical Exam:      Visit Vitals   ??? BP 114/72 (BP 1 Location: Right arm, BP Patient Position: Sitting)   ??? Pulse 87   ??? Temp 97.2 ??F (36.2 ??C)   ??? Resp 16   ??? Ht 5\' 7"  (1.702 m)   ??? Wt 240 lb (108.9 kg)   ??? SpO2 100%   ??? BMI 37.59 kg/m2       Physical Exam:    General:  Alert, cooperative, AA male no distress, appears stated age.   Eyes:  Conjunctivae/corneas clear. PERRL, EOMs intact. Fundi benign   Ears:  Normal TMs and external ear canals both ears.   Nose: Nares normal. Septum midline. Mucosa normal. No drainage or sinus tenderness.   Mouth/Throat: Lips, mucosa, and tongue normal. Teeth and gums normal.    Neck: Supple, symmetrical, trachea midline, no adenopathy, thyroid: no enlargement/tenderness/nodules, no carotid bruit and no JVD.   Back:   Symmetric, no curvature. ROM normal. No CVA tenderness.   Lungs:   Clear to auscultation bilaterally.   Heart:  Regular rate and rhythm, S1, S2 normal, no murmur, click, rub or gallop.   Abdomen:   Soft, non-tender. Bowel sounds normal. No masses,  No organomegaly.   Extremities: Extremities normal, atraumatic, no cyanosis or edema.   Pulses: 2+ and symmetric all extremities.   Skin: Skin color, texture, turgor normal. No rashes or lesions   Lymph nodes: Cervical,  supraclavicular, and axillary nodes normal.   Neurologic: CNII-XII intact. Normal strength, sensation and reflexes throughout.           Assessment/Plan     Bizarre behavior and conversation  Labs reviewed [facility](+[facility])  Treatment per physician's orders

## 2016-08-18 NOTE — H&P (Signed)
[facility]  BEHAVIORAL HEALTH ADMIT NOTE -PSYCH    Sabino Donovaname:Neyman, Bishoy  MR#: [id number]  DOB: [birth year]  ACCOUNT #: [id number]   DATE OF SERVICE: 08/17/2016    HISTORY OF PRESENT ILLNESS:  This is a 21 year old male, unspecified history of current cannabis use, presents to the hospital voluntarily for some concerns parents had about recent paranoia.  Patient apparently in the ED was very paranoid, could not even speak to staff, poor eye contact.  Currently, as assessment patient is calm and cooperative.  He is guarded in his speech, not saying very much, only answering with one word answers, though has good eye contact and denies depressive symptomatology and denies suicidal or homicidal ideation, denies current hallucinations and no delusions.    PSYCHIATRIC HISTORY:  Unclear.  The patient says that he does not follow up with any psychiatrist.  He is not currently on medications, then later says that he has had a psychiatric admission in the past but unclear.    FAMILY HISTORY:  Patient denies any family history of mental illness.      PAST MEDICAL HISTORY:  The patient denies any medical problems.      MEDICATIONS:  Not currently on any medications.      ALLERGIES:  NOT ALLERGIC TO ANY MEDICATIONS.    REVIEW OF SYSTEMS:  Negative except as per HPI.    SOCIAL HISTORY:  Patient was staying with grandmother but now going to move to stay with father.  He does currently smoke marijuana regularly.  U-tox is positive for THC.  He is a Medical laboratory scientific officersophomore in college.    MENTAL STATUS EXAMINATION:  Patient is a 21 year old well-nourished male, quiet, guarded, good eye contact, calm, cooperative, reports mood as good.  Affect is euthymic.  Thoughts are linear, somewhat impoverished.  Insight and judgment are fair.  No hallucinations.  No suicidal or homicidal ideation.    ASSESSMENT:  This is a 21 year old African-American male with unclear psychiatric history who presents with some paranoia in the context of  cannabis use, at this time patient not currently paranoid and assumes that his paranoia was in the context of marijuana use and not underlying psychotic disorder, not currently suicidal or homicidal.    DIAGNOSES:  Cannabis use disorder.    PLAN:    1.  Admit to inpatient psychiatric unit.  We will continue to observe.  2.  Obtain collateral for further care.  Hold out on psychiatric medications as no clear indication for them at this time.    Estimated length of stay 3-5 days.      Maidie Streight L. Genevie AnnEINOLD, MD       SLR / MN  D: 08/18/2016 11:46     T: 08/18/2016 17:56  JOB #: [id number]

## 2016-08-19 NOTE — Progress Notes (Signed)
Problem: Falls - Risk of  Goal: *Absence of Falls  Patient will remain free of falls every shift throughout hospital stay.  Patient will verbalize understanding regarding safety and fall prevention measures to be utilized every shift throughout hospital stay.     Document Bridgette HabermannSchmid Fall Risk and appropriate interventions in the flowsheet.    Fall Risk Interventions:        Teach pt to rise slowly  Gripper socks  Keep environment clutter free                       Problem: Psychosis  Goal: *STG: Decreased hallucinations  Patient will verbalize denial of auditory/visual hallucinations every shift throughout hospital stay.    Outcome: Progressing Towards Goal  Pt will have decreased hallucinations daily during this admission.  Goal: *STG: Decreased delusional thinking  Patient will show a decrease or be free of delusional statements assessed every shift throughout hospital stay.       Outcome: Progressing Towards Goal  Pt will have decreased delusional thinking daily during this admission.  Goal: *STG: Remains safe in hospital  Patient will remain free of harm to self and others every shift throughout hospitalization.   Outcome: Progressing Towards Goal  Pt will remain safe in hospital daily during this admission.  Goal: *STG: Seeks staff when feelings of self harm or harm towards others arise  Patient will be successful in seeking staff support and remaining free of harm to self and others daily throughout hospitalization.   Outcome: Progressing Towards Goal  Pt will seek staff when feelings of self harm or harm towards others arise daily during this admission.  Goal: *STG: Participates in individual and group therapy  Patient will attend at least 50% or 2 of 4 groups daily throughout hospital stay.   Outcome: Progressing Towards Goal  Pt will participate in individual and group therapy daily during this admission.  Goal: *STG/LTG: Complies with medication therapy   Patient will take medication as prescribed every shift throughout hospital stay.    Outcome: Progressing Towards Goal  Pt will comply with medication therapy daily during this admission.    Problem: Substance Use - Stabilization Plan  Goal: Patient/Family Education  Patient and family will verbalize understanding regarding illness and management of condition throughout hospital stay to prevent relapse and ensure continuity of care.     Outcome: Progressing Towards Goal  Pt and family will verbalize understanding regarding patient illness during this admission.    Comments: Patient has been in bed most of shift. He denies suicidal/homicidal ideations and AV hallucinations. He stated he is here "My dad sent me here because o my mental health". This Clinical research associatewriter asked patient to elaborate on his mental health. He stated "I don't think anything is wrong with my mental health". He ate dinner. He received a visit from his mother. Nursing will continue to provide a safe and supportive environment.

## 2016-08-19 NOTE — Other (Signed)
ACTIVITIES THERAPY PROGRESS NOTE    Group time:1220    The patient declined group.

## 2016-08-19 NOTE — Behavioral Health Treatment Team (Signed)
Patient has been calm and cooperative upon approach. Patient has been visible in milieu and is social with staff. Patient has changed clothes. Patient denies suicidal ideation with no safety issues noted, denies auditory hallucinations. Will monitor for safety with support as needed throughout treatment regimen.

## 2016-08-19 NOTE — Other (Signed)
ART THERAPY GROUP PROGRESS NOTE    PATIENT SCHEDULED FOR GROUP AT: 1400    ATTENDANCE: Full    PARTICIPATION LEVEL: Participates fully in the art process    ATTENTION LEVEL: Able to focus on task    FOCUS: Grounding     SYMBOLIC & THEMATIC CONTENT AS NOTED IN IMAGERY: He was calm, guarded, and presented with a blunted affect. He was soft-spoken and did not interact unless directly prompted. He appeared to be some-what thought-blocked and his responses were very vague and guarded.

## 2016-08-19 NOTE — Other (Signed)
SW made contact with patient as he interacted with others within the milieu.  Patient's conversation and purpose of admission was unclear.  Patient reports no history of trauma.  Denies SI/HI.  Patient reports he sleeps well and has good appetite.  Patient endorses marijuana use.   Patient reports he was hanging out with "them" and now he is like 'them".  SW asked for clarification and patient pointed to his foot referring to a past surgery.  Patient reports no AVH at this present time.  Patient reports he was in college and was doing well.  Patient was unable to explain triggers that caused his mental instability.  Patient was unable to explain his reason for admittance.  SW encouraged patient to attend group activities.  SW will contact parents for additional information. SW will monitor and support patient during hospitalization.    Denny LevyMonica McLellan, MSW, Supervisee in Social Work  Child psychotherapistocial Worker  [phone]

## 2016-08-19 NOTE — Other (Signed)
SOCIAL WORK GROUP THERAPY PROGRESS NOTE      Time: 1:50    Group Topic: Positive Coping Skills/ Coping Skills Jeopardy/Changing My Thinking-Change My Mood     Group Participation: SW and mental health staff encouraged pt to participate in today???s group. Pt. declined.

## 2016-08-19 NOTE — Progress Notes (Addendum)
[facility]  [facility]     Date of Service: 08/19/16  Hospital Day: 2     Subjective/Interval History   08/19/16    Treatment Team Notes:  Notes reviewed and/or discussed and report that [facility] did not require any PRN medications since admission. He is admitted due to experiencing delusions for an unclear duration of time.  Patient consumes marijuana. There is academic failure. PT reported that microchips were implanted 2 years ago.       Patient interview: Harry PortelaDuwaun Nelson was interviewed by this Clinical research associatewriter today. He denied any interval mood or psychotic issues. He frankly denied any hallucinations or delusions.  Reported stable sleep and appetite. He showers daily. Denies SI. Says he is doing well. Also say she is here due to "stress"; unsure of any triggers. Denies panic attacks.      Collateral:  Discussed collateral with father, Mr. Harry Nelson at [phone]. He reported that delusional symptoms (thoughts of chip implant where he add pins placed after a leg fracture, fear [employer] is following him), disrespectfulness, argumentativeness seems to be related to his marijuana usage.  Per Mr. Harry Nelson, Harry Nelson's mother noticed these symptoms about 6 month ago. Symptoms occur intermittently. Unsure of how long symptoms last until resolved. Father describes patient as manipulative and he withholds information. There is no diagnosed family history of mental illness.       Objective     Vitals:    08/17/16 2115 08/17/16 2150 08/18/16 1011 08/18/16 1919   BP:  120/87 114/72 122/80   Pulse:  87  91   Resp:  16 16 17    Temp:  98.5 ??F (36.9 ??C) 97.2 ??F (36.2 ??C) 98.6 ??F (37 ??C)   SpO2:       Weight: 108.9 kg (240 lb)      Height: 5\' 7"  (1.702 m)          Mental Status Examination     Appearance/Hygiene 21 year old African-American male  Good hygiene  Overweight  Casually but appropriately dressed   Behavior/Social Relatedness Superficial  Staring eye contact  Relates ok      Musculoskeletal Gait/Station: appropriate  Tone (flaccid, cogwheeling, spastic): not assessed  Psychomotor (hyperkinetic, hypokinetic): appropriate   Involuntary movements (tics, dyskinesias, akathisa, stereotypies): none   Speech   Rate, rhythm, volume, fluency and articulation are appropriate   Mood   "good"   Affect    bright   Thought Process Linear and goal directed   Thought Content and Perceptual Disturbances Denies self-injurious behavior (SIB), suicidal ideation (SI), aggressive behavior or homicidal ideation (HI)    Denies auditory and visual hallucinations   Sensorium and Cognition  Grossly intact   Insight  poor   Judgment poor        Assessment/Plan      Psychiatric Diagnoses:   Patient Active Problem List   Diagnosis Code   ??? 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 who is currently not displaying any frank psychotic symptoms. He denies any delusions or hallucinations today. Per chart review, patient has experienced intermittent delusions and academic decline over the past 6 months. His symptoms could be prodromal for a primary psychotic process vs substance induced.  He currently has some interpersonal oddness but does  not present as thought disordered. He is displaying appropriate self-care.    1.  Hold anti-psychotic as patient does not present as psychotic.    2.  Discussed collateral with father, Mr. Culbreath who will be actively involved to assess whether his son is approaching baseline.   3.  Disposition/Discharge Date: self-care/home, 2-3 days    Harry Ade, MD  [facility]  Psychiatry

## 2016-08-19 NOTE — Behavioral Health Treatment Team (Signed)
GROUP THERAPY PROGRESS NOTE    Harry Nelson did not participate in Focus group..Marland Kitchen

## 2016-08-20 NOTE — Progress Notes (Signed)
Patient pleasant on approach. Out in day area attending groups. He denies suicidal thoughts. Day area for most of the day. Voiced no complaints.

## 2016-08-20 NOTE — Behavioral Health Treatment Team (Cosign Needed)
M.H.T. Note: The above pt was alert and oriented but quiet the entire shift. He has appeared very isolative majority of the morning. He has been out of his room the afternoon portion part of the shift. He has not been a management problem this shift. He participated in group and he played cards with his peers during his spare time while sitting out in the day area. He has not been a management problem this shift.-A-Problem #1 cont.-P-Maintain a safe and supportive environment.

## 2016-08-20 NOTE — Behavioral Health Treatment Team (Signed)
GROUP THERAPY PROGRESS NOTE    Fredis Rund is participating in Relaxation group.     Group time: 30 min    Personal goal for participation: Being able to find time for oneself to relax.    Goal orientation: relaxation    Group therapy participation: active    Therapeutic interventions reviewed and discussed: What are ways to relax.    Impression of participation: Pt participated in group activity discussion.

## 2016-08-20 NOTE — Other (Signed)
OCCUPATIONAL THERAPY PROGRESS NOTE  Group Time:  1230  Attendance:    The patient attended full group.  Participation:  The patient participated with minimal elaboration in the activity.   Attention:  The patient needed redirection to activity at least once.  Interaction:  The patient acknowledges others or responds to questions,  with no spontaneous interaction.  Sitting quietly most of group, unable to ID any stressors even with help.  Appeared preoccupied.

## 2016-08-20 NOTE — Other (Signed)
SOCIAL WORK GROUP THERAPY PROGRESS NOTE      Time: 11:20     Group Topic: Discharge Planning and Positive Coping Skills    Group Participation: SW and mental health staff encouraged pt to participate in today???s group. Pt. declined.

## 2016-08-20 NOTE — Progress Notes (Signed)
Problem: Falls - Risk of  Goal: *Absence of Falls  Patient will remain free of falls every shift throughout hospital stay.  Patient will verbalize understanding regarding safety and fall prevention measures to be utilized every shift throughout hospital stay.     Document Bridgette HabermannSchmid Fall Risk and appropriate interventions in the flowsheet.    Outcome: Progressing Towards Goal  Fall Risk Interventions:                   Patient remains free from falls.      Problem: Psychosis  Goal: *STG: Decreased hallucinations  Patient will verbalize denial of auditory/visual hallucinations every shift throughout hospital stay.    Outcome: Progressing Towards Goal  Patient denied hallucinations.  Goal: *STG: Remains safe in hospital  Patient will remain free of harm to self and others every shift throughout hospitalization.   Outcome: Progressing Towards Goal  Patient demonstrated safe behavior throughout shift.  Goal: *STG: Participates in individual and group therapy  Patient will attend at least 50% or 2 of 4 groups daily throughout hospital stay.   Outcome: Progressing Towards Goal  Patient attended and participated in group this evening.    Comments: Patient is alert and oriented x 4, pleasant and cooperative, suspicious affect, but denied feeling paranoid, "all right" mood, denied thoughts of harm to self or others, denied hallucinations. Patient stated that he is here because, "I smoked 2 joints of marijuana...1 joint too many." This Clinical research associatewriter discussed with patient the importance to abstain from marijuana use; verbalized understanding. Patient received a visit from his mother this evening, "good" visit. Patient attended and participated in group; "good" appetite; will monitor and maintain safe environment.

## 2016-08-20 NOTE — Other (Signed)
SW made contact with patient who reports he is feeling better.  Patient is pleasant and expressed no major issues or concerns.  Patient denies SI/HI.  Patient denies AVH.  SW made contact with mother Harry Nelson([name] [phone]) who reports she noticed a change in patients behaviors after he left college.  Mother reports patient began speaking unclear thoughts and she was unsure of the causes.  Mother reports patient may have utilized a synthetic drug and this may have caused these psychotic behaviors.  Patient denies a history of drug use.  SW encouraged patient to attend group activities.  SW encouraged positive peer interaction.  SW will request a family session upon discharge.  SW will continue to monitor patient during hospitalization following recommendations made by Dr. Shea EvansBelcher.    Denny LevyMonica McLellan, MSW, Supervisee in Social Work  Child psychotherapistocial Worker  [phone]

## 2016-08-20 NOTE — Other (Signed)
GROUP THERAPY PROGRESS NOTE    Harry Nelson did not  participated in [facility] even with staffs encouragement .

## 2016-08-20 NOTE — Progress Notes (Signed)
[facility]  Inpatient Progress Note     Date of Service: 08/20/16  Hospital Day: 3     Subjective/Interval History   08/20/16    Treatment Team Notes:  Notes reviewed and/or discussed and report that [facility] did not receive any interval PRNs. He denies any mental health problems. Attended few groups. .       Patient interview: Harry PortelaDuwaun Nelson was interviewed by this Clinical research associatewriter today. He denied any tactile/visual/auditory hallucinations as well as thought insertion, thought broadcasting or paranoid delusions today. He discussed his previous ankle surgery; he says that pins were placed. Denied concerns of microchip placement. Feels safe in the hospital and at home.  Does not recall making delusional statements.  Reported adequate sleep and stable appetite.  Denies SI. No concerns.         Objective     Vitals:    08/17/16 2150 08/18/16 1011 08/18/16 1919 08/19/16 1855   BP: 120/87 114/72 122/80 125/79   Pulse: 87  91 88   Resp: 16 16 17 17    Temp: 98.5 ??F (36.9 ??C) 97.2 ??F (36.2 ??C) 98.6 ??F (37 ??C) 98.4 ??F (36.9 ??C)   SpO2:       Weight:       Height:           Mental Status Examination     Appearance/Hygiene 21 year old African-American male  Good hygiene  Overweight  Casually but appropriately dressed   Behavior/Social Relatedness Superficial  Staring eye contact  Relates oddly     Musculoskeletal Gait/Station: appropriate  Tone (flaccid, cogwheeling, spastic): not assessed  Psychomotor (hyperkinetic, hypokinetic): appropriate   Involuntary movements (tics, dyskinesias, akathisa, stereotypies): none   Speech   Rate, rhythm, volume, fluency and articulation are appropriate   Mood   restricted   Affect    restricted   Thought Process concrete   Thought Content and Perceptual Disturbances Denies self-injurious behavior (SIB), suicidal ideation (SI), aggressive behavior or homicidal ideation (HI)    Denies auditory and visual hallucinations   Sensorium and Cognition  Grossly intact   Insight  poor    Judgment poor        Assessment/Plan      Psychiatric Diagnoses:   Patient Active Problem List   Diagnosis Code   ??? 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 who is currently not displaying any frank psychotic symptoms on my assessment; however, SW reported that patient did discuss paranoid delusions on her assessment.  Per chart review, patient has experienced intermittent delusions and academic decline over the past 6 months. His symptoms could be prodromal for a primary psychotic process vs substance induced.  He currently has some interpersonal oddness. He is displaying appropriate self-care.    1.  Consider starting Abilify 5 or 10mg  tomorrow  2.  Stressed avoiding cannabis.   3.  Discussed collateral with father, Harry Nelson who will be actively involved to assess whether his son is approaching baseline.   4.  Disposition/Discharge Date: self-care/home, Thursday or Friday    Harry AdeXavier W. Robi Dewolfe, MD  [facility]  Psychiatry

## 2016-08-21 NOTE — Progress Notes (Signed)
Problem: Falls - Risk of  Goal: *Absence of Falls  Patient will remain free of falls every shift throughout hospital stay.  Patient will verbalize understanding regarding safety and fall prevention measures to be utilized every shift throughout hospital stay.     Document Harry Nelson Fall Risk and appropriate interventions in the flowsheet.    Outcome: Progressing Towards Goal  Fall Risk Interventions:                  Patient remains free from falls; gripper socks intact to feet      Problem: Psychosis  Goal: *STG: Decreased hallucinations  Patient will verbalize denial of auditory/visual hallucinations every shift throughout hospital stay.    Outcome: Progressing Towards Goal  Denied hallucinations this shift.  Goal: *STG: Remains safe in hospital  Patient will remain free of harm to self and others every shift throughout hospitalization.   Outcome: Progressing Towards Goal  Demonstrated safe behavior throughout shift.    Comments: Patient is alert and oriented x 4, pleasant, still slightly watchful/suspicious affect, "good" mood, denied thoughts of harm to self or others, denied hallucination; patient does not have much to say and no concerns voiced; will monitor and maintain safe environment.

## 2016-08-21 NOTE — Other (Signed)
ART THERAPY GROUP PROGRESS NOTE    PATIENT SCHEDULED FOR GROUP AT: 14:30    ATTENDANCE: Full    PARTICIPATION LEVEL: Participates fully in the art process    ATTENTION LEVEL: Able to focus on task    FOCUS: Problem-solving    SYMBOLIC & THEMATIC CONTENT AS NOTED IN IMAGERY: He appeared more alert and oriented than noted in previous art therapy group (08/19/16) and his affect was slightly brighter, however still blunted. He kept to himself unless directly prompted, and smiled when spoken to. He did appear distant while others conversed during group. He was invested in the task and his approach to the artwork was planned-out and imagery logical. Associations were relevant and general.

## 2016-08-21 NOTE — Progress Notes (Signed)
[facility]  Inpatient Progress Note     Date of Service: 08/21/16  Hospital Day: 4     Subjective/Interval History   08/21/16    Treatment Team Notes:  Notes reviewed and/or discussed and report that [facility] is isolative and vague. SW contacted mother for collateral. SW reported that patient was delusional on her assessment.       Patient interview: Harry PortelaDuwaun Nelson was interviewed by this Clinical research associatewriter today. Pt continues to deny any active hallucinations or paranoid delusions. He showered yesterday. Eats 3 meals daily. Slept well. Denies mood symptoms. Somewhat superficial/vague in responses but thoughts are linear. Denies stress or triggers. Declined starting Abilify or other medication to help with "stress"    Objective     Vitals:    08/18/16 1919 08/19/16 1855 08/20/16 2000 08/21/16 0815   BP: 122/80 125/79 119/79 136/87   Pulse: 91 88 80 69   Resp: 17 17 18 18    Temp: 98.6 ??F (37 ??C) 98.4 ??F (36.9 ??C) 98.7 ??F (37.1 ??C) 97.9 ??F (36.6 ??C)   SpO2:       Weight:       Height:           Mental Status Examination     Appearance/Hygiene 21 year old African-American male  Good hygiene  Overweight  Casually but appropriately dressed   Behavior/Social Relatedness Superficial  Staring eye contact  Relates oddly     Musculoskeletal Gait/Station: appropriate  Tone (flaccid, cogwheeling, spastic): not assessed  Psychomotor (hyperkinetic, hypokinetic): appropriate   Involuntary movements (tics, dyskinesias, akathisa, stereotypies): none   Speech   Rate, rhythm, volume, fluency and articulation are appropriate   Mood   restricted   Affect    restricted   Thought Process concrete   Thought Content and Perceptual Disturbances Denies self-injurious behavior (SIB), suicidal ideation (SI), aggressive behavior or homicidal ideation (HI)    Denies auditory and visual hallucinations   Sensorium and Cognition  Grossly intact   Insight  poor   Judgment poor        Assessment/Plan      Psychiatric Diagnoses:    Patient Active Problem List   Diagnosis Code   ??? 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 who is currently not displaying any frank psychotic symptoms on my assessment; however, SW reported that patient did discuss paranoid delusions on her assessment yesterday.  Per chart review, patient has experienced intermittent delusions and academic decline over the past 6 months. His symptoms could be prodromal for a primary psychotic process vs substance induced.  He currently has some interpersonal oddness. He is displaying appropriate self-care.     1.  Patient declined starting anti-psychotic  2.  Again stressed avoiding cannabis.   4.  Disposition/Discharge Date: self-care/home, Thursday or Friday    Harry AdeXavier W. Marquis Diles, MD  [facility]  Psychiatry

## 2016-08-21 NOTE — Behavioral Health Treatment Team (Cosign Needed)
Pt quiet, mostly solitary, in milieu intermittently for meals and groups.  Mood limited, affect congruent with mood and pensive; appears to stare at staff and peers, speech often ambiguous, odd laughter/smiling, cooperative and compliant, interaction and insight minimal to none.  No behavioral issues during shift, denies intent to harm self/others and A/V hallucinations, involved in no falls this shift - skid resistant footwear utilized and floor kept free of items that could precipitate a fall.

## 2016-08-21 NOTE — Other (Signed)
SW made contact with patient who presents well.  Patient thoughts are logical and clear.  Patient remains vague when answering questions.  Patient reports no SI/HI.  Patient denies AVH.  Patient reports he does not recall speaking of an implant in his foot.  Patient reports he is feeling "much better" and understands that he does not have an implant in his foot.  Mom reports a great visit with patient last night.  She reports upon discharge he will reside with his father however, both parents will remain active in patients treatment.  SW encouraged patient to continue with group activities as well as positive peer interaction.  SW will complete disposition.      Denny LevyMonica McLellan, MSW, Supervisee in Social Work  Child psychotherapistocial Worker  [phone]

## 2016-08-21 NOTE — Behavioral Health Treatment Team (Signed)
GROUP THERAPY PROGRESS NOTE    Harry Nelson is participating in Nutritional group     Group time: 30 min    Personal goal for participation: Maintaining a healthy balance diet    Goal orientation: personal    Group therapy participation: active    Therapeutic interventions reviewed and discussed: Importants of eating healthy.    Impression of participation: Pt participated in group discussion.

## 2016-08-21 NOTE — Behavioral Health Treatment Team (Cosign Needed)
GROUP THERAPY PROGRESS NOTE    Maxmilian Bruno is participating in [facility].     Group time: 30 minutes    Personal goal for participation: discuss guideline compliance, unit issues and community announcements; discuss daily Tx goal(s)                 Goal orientation: community    Group therapy participation: active

## 2016-08-21 NOTE — Behavioral Health Treatment Team (Signed)
Patient did not participate in group today she spent most of the  day in her room relaxing she got up ate her food and took her medication, she did not display any negative or aggressive behavior, staff will continue to monitor patient.

## 2016-08-21 NOTE — Other (Signed)
OCCUPATIONAL THERAPY PROGRESS NOTE  Group Time:  1045  Attendance:    The patient attended full group.  Participation:  The patient participated with minimal elaboration in the activity. .  Attention:  The patient was able to focus on the activity.  Interaction:  The patient acknowledges others or responds to questions,  with no spontaneous interaction.  Continues quiet with responses only on activity which he was able to attend to.  Answers without elaboration, but appropriate to simple, concrete questions.

## 2016-08-22 MED FILL — TRAZODONE 50 MG TAB: 50 mg | ORAL | Qty: 1

## 2016-08-22 NOTE — Behavioral Health Treatment Team (Signed)
GROUP THERAPY PROGRESS NOTE    Harry Nelson is participating in Goals Group.     Group time: 30 min.    Personal goal for participation: Staying focus on achieving their goals.    Goal orientation: personal    Group therapy participation: active    Therapeutic interventions reviewed and discussed: The importance of having goals in life.    Impression of participation: Pt participated in group discussion.

## 2016-08-22 NOTE — Progress Notes (Signed)
[facility]  Inpatient Progress Note     Date of Service: 08/22/16  Hospital Day: 5     Subjective/Interval History   08/22/16    Treatment Team Notes:  Notes reviewed and/or discussed and report that [facility] is suspicious and guarded, affect blunted, logical imagery.       Patient interview: Suzie PortelaDuwaun Bridwell was interviewed by this Clinical research associatewriter today. Pt denies paranoid delusions today. No hallucinations. AOx4. Denies anxiety/stress. Continues to be disinterested in starting Abilify.     Objective     Vitals:    08/20/16 2000 08/21/16 0815 08/21/16 1916 08/22/16 0811   BP: 119/79 136/87 134/86 130/88   Pulse: 80 69 70 80   Resp: 18 18 18 18    Temp: 98.7 ??F (37.1 ??C) 97.9 ??F (36.6 ??C) 98 ??F (36.7 ??C) 97.8 ??F (36.6 ??C)   SpO2:       Weight:       Height:           Mental Status Examination     Appearance/Hygiene 21 year old African-American male  Good hygiene  Overweight  Casually but appropriately dressed   Behavior/Social Relatedness Superficial  Staring eye contact  Relates oddly     Musculoskeletal Gait/Station: appropriate  Tone (flaccid, cogwheeling, spastic): not assessed  Psychomotor (hyperkinetic, hypokinetic): appropriate   Involuntary movements (tics, dyskinesias, akathisa, stereotypies): none   Speech   Rate, rhythm, volume, fluency and articulation are appropriate   Mood   restricted   Affect    restricted   Thought Process concrete   Thought Content and Perceptual Disturbances Denies self-injurious behavior (SIB), suicidal ideation (SI), aggressive behavior or homicidal ideation (HI)    Denies auditory and visual hallucinations   Sensorium and Cognition  Grossly intact   Insight  poor   Judgment poor        Assessment/Plan      Psychiatric Diagnoses:   Patient Active Problem List   Diagnosis Code   ??? 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 who is likely experiencing a substance induced psychotic disorder is stabilizing. His psychotic symptoms prior to admission is not worsening. He has some vagueness on interview and oddness in interactions suggesting some degree of thought disorder.  Family is concerned.     1.  Patient again declined starting anti-psychotic  2.  Again stressed avoiding cannabis.   3.  SW yesterday reported she will be hosting a family session to address family's concerns.   4.  Disposition/Discharge Date: self-care/home,  Friday    Glyn AdeXavier W. Leyani Gargus, MD  [facility]  Psychiatry

## 2016-08-22 NOTE — Behavioral Health Treatment Team (Signed)
M.H.T. Note: The above pt has been pleasant upon approach this shift. He has not been a management problem this shift. He has been social with staff and cooperative with staff. He has been complaint with medications this shift. He has not been a management problem this shift. He has participated in all groups this shift. He has not experienced any falls during this shift.-A-Problem #1 cont.-P-Maintain a safe and supportive environment.

## 2016-08-22 NOTE — Behavioral Health Treatment Team (Signed)
Denies SI HI AVH. Offers no complaints. Cooperative. Eats and tolerates evening meal. Gripper socks and 15 minute checks in place for safety. Takes medicines without incident. Will continue to monitor and support.

## 2016-08-22 NOTE — Other (Signed)
SW Contact:  Pt was somewhat distant & possibly preoccupied by internal stimuli.. But did comment he's willing to go to outpt tx.. Was encouraged to attend groups & activities.

## 2016-08-22 NOTE — Progress Notes (Signed)
Problem: Falls - Risk of  Goal: *Absence of Falls  Patient will remain free of falls every shift throughout hospital stay.  Patient will verbalize understanding regarding safety and fall prevention measures to be utilized every shift throughout hospital stay.     Document Harry Nelson Fall Risk and appropriate interventions in the flowsheet.    Outcome: Progressing Towards Goal  Fall Risk Interventions: absent of falls daily                               Problem: Psychosis  Goal: *STG: Remains safe in hospital  Patient will remain free of harm to self and others every shift throughout hospitalization.   Outcome: Progressing Towards Goal  Stays safe in hospital daily.  Goal: *STG/LTG: Complies with medication therapy  Patient will take medication as prescribed every shift throughout hospital stay.    Outcome: Progressing Towards Goal  Complies with medication therapy daily.    Comments: Absent of falls daily.

## 2016-08-22 NOTE — Other (Signed)
OCCUPATIONAL THERAPY PROGRESS NOTE    Group Time:  1220  Attendance:    The patient attended full group..  Participation:  The patient participated with moderate elaboration in the activity.  Attention:  The patient was able to focus on the activity.  Interaction:  The patient occasionally  interacts with others.  Affect more range and more reactive.  Occasional spontaneous comments.

## 2016-08-22 NOTE — Behavioral Health Treatment Team (Cosign Needed)
GROUP THERAPY PROGRESS NOTE    Harry Nelson is not participating in [employer].     Group time: 30 minutes    Personal goal for participation: none    Goal orientation: community    Group therapy participation: passive    Therapeutic interventions reviewed and discussed: goals and procedures    Impression of participation: enourged

## 2016-08-23 NOTE — Discharge Summary (Signed)
[facility]  Inpatient Psychiatry   Discharge Summary     Admit date: 08/17/2016     Discharge date and time: 08/23/2016  3:20 PM    Discharge Physician: Ronnald Collum, MD    DISCHARGE DIAGNOSES     Psychiatric Diagnoses:        Patient Active Problem List   Diagnosis Code   ??? 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  Medical Diagnoses: none per report  ??  Psychosocial and contextual factors: academic failure, marijuana usage  ??  Level of impairment/disability: moderate      HOSPITAL COURSE     Harry Nelson is a 21 year old African-American male without prior psychiatric diagnosis who presented voluntarily for admission after reporting paranoid delusions of microchip implant and being tracked by the [employer].  Documentation also reported that Harry Nelson has been disrespectful towards a family member.  These symptoms are in the context of marijuana usage and academic decline over the past 6 months.  For more information, please review the intake note dated on 08/17/16.      During his admission, Harry Nelson reported inconsistent paranoid delusions initially; however, consistently denied psychotic symptoms on subsequent days.  He admitted to recent marijuana usage.  He gave vague details on his usage.  He expressed difficulty with stress but this concern resolved during his admission.  He did not express any other psychotic symptoms, denying any thought insertion, thought withdrawal or hallucinations. He denied any depression or depressive symptoms.  Behaviorally, Harry Nelson was isolative and somewhat suspicious.  He partially participated in groups.  He displayed appropriate hygiene, sleep and appetite.  On discharge, he did not present as thought disordered.      Harry Nelson was counseled on marijuana usage and its effects on the mind and body.  He expressed some interest in discontinuing his marijuana usage.   He did not develop a plan of ensuring his sobriety.  He vocalized understanding that his marijuana usage is likely contributing to his behavioral changes and psychotic symptoms.    Of note, Harry Nelson declined starting an anti-psychotic medication during this admission.     DISPOSITION/FOLLOW-UP     Disposition: home    Follow-up Appointments:  Patient is scheduled with Isabel Caprice with [facility] group on 08/27/16 @ 7:00pm  [place] [address]  [phone]      MEDICATION CHANGES   Outpatient medications:  No current facility-administered medications on file prior to encounter.      No current outpatient prescriptions on file prior to encounter.         Medications discontinued during hospitalization:  There are no discontinued medications.      Discharged medication:  There are no discharge medications for this patient.      LABS/IMAGING DURING ADMISSION     Results for orders placed or performed during the hospital encounter of 08/17/16   ETHYL ALCOHOL   Result Value Ref Range    ALCOHOL(ETHYL),SERUM <3 0 - 3 MG/DL   DRUG SCREEN, URINE   Result Value Ref Range    BENZODIAZEPINES NEGATIVE  NEG      BARBITURATES NEGATIVE  NEG      THC (TH-CANNABINOL) POSITIVE (A) NEG      OPIATES NEGATIVE  NEG      PCP(PHENCYCLIDINE) NEGATIVE  NEG      COCAINE NEGATIVE  NEG      AMPHETAMINES NEGATIVE  NEG      METHADONE NEGATIVE  NEG      HDSCOM (NOTE)    CBC WITH AUTOMATED DIFF   Result Value Ref Range    WBC 7.2 4.6 - 13.2 K/uL    RBC 5.28 4.70 - 5.50 M/uL    HGB 15.5 13.0 - 16.0 g/dL    HCT 44.3 36.0 - 48.0 %    MCV 83.9 74.0 - 97.0 FL    MCH 29.4 24.0 - 34.0 PG    MCHC 35.0 31.0 - 37.0 g/dL    RDW 12.4 11.6 - 14.5 %    PLATELET 224 135 - 420 K/uL    MPV 10.2 9.2 - 11.8 FL    NEUTROPHILS 50 40 - 73 %    LYMPHOCYTES 39 21 - 52 %    MONOCYTES 10 3 - 10 %    EOSINOPHILS 1 0 - 5 %    BASOPHILS 0 0 - 2 %    ABS. NEUTROPHILS 3.6 1.8 - 8.0 K/UL    ABS. LYMPHOCYTES 2.8 0.9 - 3.6 K/UL     ABS. MONOCYTES 0.7 0.05 - 1.2 K/UL    ABS. EOSINOPHILS 0.1 0.0 - 0.4 K/UL    ABS. BASOPHILS 0.0 0.0 - 0.1 K/UL    DF AUTOMATED     METABOLIC PANEL, COMPREHENSIVE   Result Value Ref Range    Sodium 139 136 - 145 mmol/L    Potassium 3.6 3.5 - 5.5 mmol/L    Chloride 105 100 - 108 mmol/L    CO2 27 21 - 32 mmol/L    Anion gap 7 3.0 - 18 mmol/L    Glucose 93 74 - 99 mg/dL    BUN 18 7.0 - 18 MG/DL    Creatinine 1.05 0.6 - 1.3 MG/DL    BUN/Creatinine ratio 17 12 - 20      GFR est AA >60 >60 ml/min/1.20m    GFR est non-AA >60 >60 ml/min/1.7108m   Calcium 9.3 8.5 - 10.1 MG/DL    Bilirubin, total 0.6 0.2 - 1.0 MG/DL    ALT (SGPT) 21 16 - 61 U/L    AST (SGOT) 21 15 - 37 U/L    Alk. phosphatase 115 45 - 117 U/L    Protein, total 8.4 (H) 6.4 - 8.2 g/dL    Albumin 4.3 3.4 - 5.0 g/dL    Globulin 4.1 (H) 2.0 - 4.0 g/dL    A-G Ratio 1.0 0.8 - 1.7          DISCHARGE MENTAL STATUS EVALUATION     Appearance/Hygiene [age]ear old African-American male  Good hygiene  Overweight  Casually but appropriately dressed   Behavior/Social Relatedness Superficial  Staring eye contact  Relates peculiarly    Musculoskeletal Gait/Station: appropriate  Tone (flaccid, cogwheeling, spastic): not assessed  Psychomotor (hyperkinetic, hypokinetic): appropriate   Involuntary movements (tics, dyskinesias, akathisa, stereotypies): none   Speech                          Soft spoken   Mood                          restricted   Affect  restricted   Thought Process linear   Thought Content and Perceptual Disturbances Denies self-injurious behavior (SIB), suicidal ideation (SI), aggressive behavior or homicidal ideation (HI)  ??  Denies auditory and visual hallucinations   Sensorium and Cognition              Grossly intact   Insight              fair   Judgment fair   ??      SUICIDE RISK ASSESSMENT     []  Admission  [x]  Discharge     Key Factors:   Current admission precipitated by suicide attempt?  []   Yes      2    [x]   No     1     Suicide Attempt History  []  Past attempts of high lethality    2 []   Past attempts of low lethality    1 [x]   No previous attempts       0   Suicidal Ideation []   Constant suicidal thoughts      2 []   Intermittent or fleeting suicidal  thoughts  1 [x]   Denies current suicidal thoughts    0   Suicide Plan   []   Has plan with actual OR potential access to planned method    2 []   Has plan without access to planned method      1 [x]   No plan            0   Plan Lethality []   Highly lethal plan (Carbon monoxide, gun, hanging, jumping)    2 []   Moderate lethality of plan          1 [x]   Low lethality of plan (biting, head banging, superficial scratching, pillow over face)  0   Safety Plan Agreement  []   Unwilling OR unable to agree due to impaired reality testing   2   []   Patient is ambivalent and/or guarded      1 [x]   Reliably agrees        0   Current Morbid Thoughts (reunion fantasies, preoccupations with death) []   Constantly     2     []   Frequently    1 [x]   Rarely    0   Elopement Risk  []   High risk     2 []   Moderate risk    1 [x]    Low risk    0   Symptoms    []   Hopeless  []   Helpless  []   Anhedonia   []   Guilt/shame  []   Anger/rage  []   Anxiety  []   Insomnia   []   Agitation   []   Impulsivity  []   5-6 symptoms present    2 []   3-4 symptoms present    1  [x]   0-2 symptoms present    0     Scoring Key:  10 or higher = Imminent Risk (consider 1:1)  4 - 9 = Moderate Risk (consider q 15 minute observation)Attended alcohol, tobacco, prescription and other drug psychoeducation group.   0 - 3 = Low Risk (consider q 30 minute observation)    Total Score: 1  ------------------------------------------------------------------------------------------------------------------  PLEASE ADDRESS THE FOLLOWING 5 ISSUES     Physician's Subjective Appraisal of Risk (check one):  []   Patient replies not trustworthy: several non-verbal cues.   []   Patient replies questionable: trustworthy: at least 1 non-verbal cue.  [x]   Patient replies appear  trustworthy.    Family History of Suicide?   []   Yes  []   No    Protective measures (select all that apply):  [x]   Successful past responses to stress  []   Spiritual/religious beliefs  [x]   Capacity for reality testing  []   Positive therapeutic relationships  [x]   Social supports/connections  [x]   Positive coping skills  [x]   Frustration tolerance/optimism  []   Children or pets in the home  []   Sense of responsibility to family  [x]   Agrees to treatment plan and follow up    Others (list):    High Risk Diagnoses (select all that apply):  []   Depression/Bipolar Disorder  [x]   Dual Diagnosis  []   Cardiovascular Disease  []   Schizophrenia  []   Chronic Pain  []   Epilepsy  []   Cancer  []   Personality Disorder  []   HIV/AIDS  []   Multiple Sclerosis    Dangerousness Assessment (Suicide, homicide, property destruction...)    Risk Factors reviewed and risk assessed to be:  []  low   [x]  low-moderate   []  moderate   []  moderate-high   []  high     Protection factors reviewed and risk assessed to be:  []  low   [x]  low-moderate   []  moderate   []  moderate-high   []  high     Response to treatment and risk assessed to be:  []  low   [x]  low-moderate   []  moderate   []  moderate-high   []  high     Support reviewed and risk assessed to be:  [x]  low   []  low-moderate   []  moderate   []  moderate-high   []  high     Acceptance of Discharge and outpatient treatment reviewed and risk assessed to be:    [x]  low   []  low-moderate   []  moderate   []  moderate-high   []  high   Overall risk assessed to be:  []  low   [x]  low-moderate   []  moderate   []  moderate-high   []  high     Completion of discharge was greater than 30 minutes. Over 50% of today's discharge was geared towards counseling and coordination of care.          Ronnald Collum, MD  Psychiatry  [facility]

## 2016-08-23 NOTE — Other (Signed)
SOCIAL WORK GROUP THERAPY PROGRESS NOTE      Time: 11:30     Group Topic: Positive Self-Talk, Positive Changes     Group Participation: SW and tech.???s encouraged pt to participatein today???s group. Pt. declined to participate in today???s group.

## 2016-08-23 NOTE — Behavioral Health Treatment Team (Signed)
Patient has been discharged to self and will follow up with Harry Nelson with [facility] group on 08/27/16 at 7:00 pm. [phone], Per social worker FYI patient has been educated and encouraged to attend N A meetings. Patient has been provided with information regarding mental health follow up care. Patient has been educated regarding seeking additional support if needed with information listed on discharge paper work and emergency card provided. Patient has been escorted off unit to awaiting father for transport to home.

## 2016-08-23 NOTE — Other (Addendum)
SW Contact:  Staff reviewed d/c & safety plan with pt.. He also encouraged to attend N A meetings.     Family Contact:  On phone with pt's dad Rosalva FerronVirgil Moore & shared with him update on pt's d/c plan, since pt will stay with family, mostly with mom & grandmom & at times with dad... Reviewed their support role, need for them to work as team, define expectations & consequences with them all being consistent.    Dad to pick up pt between 3-4PM.

## 2016-08-23 NOTE — Discharge Summary (Signed)
[facility]  Inpatient Psychiatry   Discharge Summary     Admit date: 08/17/2016     Discharge date and time: 08/23/2016  3:20 PM    Discharge Physician: Ronnald Collum, MD    DISCHARGE DIAGNOSES     Psychiatric Diagnoses:        Patient Active Problem List   Diagnosis Code   ??? 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  Medical Diagnoses: none per report  ??  Psychosocial and contextual factors: academic failure, marijuana usage  ??  Level of impairment/disability: moderate      HOSPITAL COURSE     Harry Nelson is a 21 year old African-American male without prior psychiatric diagnosis who presented voluntarily for admission after reporting paranoid delusions of microchip implant and being tracked by the [employer].  Documentation also reported that Harry Nelson has been disrespectful towards a family member.  These symptoms are in the context of marijuana usage and academic decline over the past 6 months.  For more information, please review the intake note dated on 08/17/16.      During his admission, Harry Nelson reported inconsistent paranoid delusions initially; however, consistently denied psychotic symptoms on subsequent days.  He admitted to recent marijuana usage.  He gave vague details on his usage.  He expressed difficulty with stress but this concern resolved during his admission.  He did not express any other psychotic symptoms, denying any thought insertion, thought withdrawal or hallucinations. He denied any depression or depressive symptoms.  Behaviorally, Harry Nelson was isolative and somewhat suspicious.  He partially participated in groups.  He displayed appropriate hygiene, sleep and appetite.  On discharge, he did not present as thought disordered.      Harry Nelson was counseled on marijuana usage and its effects on the mind and body.  He expressed some interest in discontinuing his marijuana usage.  He did not develop a plan of ensuring his sobriety.  He  vocalized understanding that his marijuana usage is likely contributing to his behavioral changes and psychotic symptoms.    Of note, Harry Nelson declined starting an anti-psychotic medication during this admission.     DISPOSITION/FOLLOW-UP     Disposition: home    Follow-up Appointments:  Patient is scheduled with Isabel Caprice with [facility] on 08/27/16 @ 7:00pm  [place] [address]  [phone].fx      MEDICATION CHANGES   Outpatient medications:  No current facility-administered medications on file prior to encounter.      No current outpatient prescriptions on file prior to encounter.         Medications discontinued during hospitalization:  There are no discontinued medications.      Discharged medication:  There are no discharge medications for this patient.      LABS/IMAGING DURING ADMISSION     Results for orders placed or performed during the hospital encounter of 08/17/16   ETHYL ALCOHOL   Result Value Ref Range    ALCOHOL(ETHYL),SERUM <3 0 - 3 MG/DL   DRUG SCREEN, URINE   Result Value Ref Range    BENZODIAZEPINES NEGATIVE  NEG      BARBITURATES NEGATIVE  NEG      THC (TH-CANNABINOL) POSITIVE (A) NEG      OPIATES NEGATIVE  NEG      PCP(PHENCYCLIDINE) NEGATIVE  NEG      COCAINE NEGATIVE  NEG      AMPHETAMINES NEGATIVE  NEG      METHADONE NEGATIVE  NEG      HDSCOM (NOTE)    CBC WITH AUTOMATED DIFF   Result Value Ref Range    WBC 7.2 4.6 - 13.2 K/uL    RBC 5.28 4.70 - 5.50 M/uL    HGB 15.5 13.0 - 16.0 g/dL    HCT 44.3 36.0 - 48.0 %    MCV 83.9 74.0 - 97.0 FL    MCH 29.4 24.0 - 34.0 PG    MCHC 35.0 31.0 - 37.0 g/dL    RDW 12.4 11.6 - 14.5 %    PLATELET 224 135 - 420 K/uL    MPV 10.2 9.2 - 11.8 FL    NEUTROPHILS 50 40 - 73 %    LYMPHOCYTES 39 21 - 52 %    MONOCYTES 10 3 - 10 %    EOSINOPHILS 1 0 - 5 %    BASOPHILS 0 0 - 2 %    ABS. NEUTROPHILS 3.6 1.8 - 8.0 K/UL    ABS. LYMPHOCYTES 2.8 0.9 - 3.6 K/UL    ABS. MONOCYTES 0.7 0.05 - 1.2 K/UL    ABS. EOSINOPHILS  0.1 0.0 - 0.4 K/UL    ABS. BASOPHILS 0.0 0.0 - 0.1 K/UL    DF AUTOMATED     METABOLIC PANEL, COMPREHENSIVE   Result Value Ref Range    Sodium 139 136 - 145 mmol/L    Potassium 3.6 3.5 - 5.5 mmol/L    Chloride 105 100 - 108 mmol/L    CO2 27 21 - 32 mmol/L    Anion gap 7 3.0 - 18 mmol/L    Glucose 93 74 - 99 mg/dL    BUN 18 7.0 - 18 MG/DL    Creatinine 1.05 0.6 - 1.3 MG/DL    BUN/Creatinine ratio 17 12 - 20      GFR est AA >60 >60 ml/min/1.65m    GFR est non-AA >60 >60 ml/min/1.763m   Calcium 9.3 8.5 - 10.1 MG/DL    Bilirubin, total 0.6 0.2 - 1.0 MG/DL    ALT (SGPT) 21 16 - 61 U/L    AST (SGOT) 21 15 - 37 U/L    Alk. phosphatase 115 45 - 117 U/L    Protein, total 8.4 (H) 6.4 - 8.2 g/dL    Albumin 4.3 3.4 - 5.0 g/dL    Globulin 4.1 (H) 2.0 - 4.0 g/dL    A-G Ratio 1.0 0.8 - 1.7          DISCHARGE MENTAL STATUS EVALUATION     Appearance/Hygiene [age]ear old African-American male  Good hygiene  Overweight  Casually but appropriately dressed   Behavior/Social Relatedness Superficial  Staring eye contact  Relates peculiarly    Musculoskeletal Gait/Station: appropriate  Tone (flaccid, cogwheeling, spastic): not assessed  Psychomotor (hyperkinetic, hypokinetic): appropriate   Involuntary movements (tics, dyskinesias, akathisa, stereotypies): none   Speech                          Soft spoken   Mood                          restricted   Affect  restricted   Thought Process linear   Thought Content and Perceptual Disturbances Denies self-injurious behavior (SIB), suicidal ideation (SI), aggressive behavior or homicidal ideation (HI)  ??  Denies auditory and visual hallucinations   Sensorium and Cognition              Grossly intact   Insight              fair   Judgment fair   ??      SUICIDE RISK ASSESSMENT     []  Admission  [x]  Discharge     Key Factors:   Current admission precipitated by suicide attempt?  []   Yes     2    [x]   No     1     Suicide Attempt History  []  Past  attempts of high lethality    2 []   Past attempts of low lethality    1 [x]   No previous attempts       0   Suicidal Ideation []   Constant suicidal thoughts      2 []   Intermittent or fleeting suicidal  thoughts  1 [x]   Denies current suicidal thoughts    0   Suicide Plan   []   Has plan with actual OR potential access to planned method    2 []   Has plan without access to planned method      1 [x]   No plan            0   Plan Lethality []   Highly lethal plan (Carbon monoxide, gun, hanging, jumping)    2 []   Moderate lethality of plan          1 [x]   Low lethality of plan (biting, head banging, superficial scratching, pillow over face)  0   Safety Plan Agreement  []   Unwilling OR unable to agree due to impaired reality testing   2   []   Patient is ambivalent and/or guarded      1 [x]   Reliably agrees        0   Current Morbid Thoughts (reunion fantasies, preoccupations with death) []   Constantly     2     []   Frequently    1 [x]   Rarely    0   Elopement Risk  []   High risk     2 []   Moderate risk    1 [x]    Low risk    0   Symptoms    []   Hopeless  []   Helpless  []   Anhedonia   []   Guilt/shame  []   Anger/rage  []   Anxiety  []   Insomnia   []   Agitation   []   Impulsivity  []   5-6 symptoms present    2 []   3-4 symptoms present    1  [x]   0-2 symptoms present    0     Scoring Key:  10 or higher = Imminent Risk (consider 1:1)  4 - 9 = Moderate Risk (consider q 15 minute observation)Attended alcohol, tobacco, prescription and other drug psychoeducation group.   0 - 3 = Low Risk (consider q 30 minute observation)    Total Score: 1  ------------------------------------------------------------------------------------------------------------------  PLEASE ADDRESS THE FOLLOWING 5 ISSUES     Physician's Subjective Appraisal of Risk (check one):  []   Patient replies not trustworthy: several non-verbal cues.  []   Patient replies questionable: trustworthy: at least 1 non-verbal cue.  [x]   Patient replies appear  trustworthy.    Family History of Suicide?   []   Yes  []   No    Protective measures (select all that apply):  [x]   Successful past responses to stress  []   Spiritual/religious beliefs  [x]   Capacity for reality testing  []   Positive therapeutic relationships  [x]   Social supports/connections  [x]   Positive coping skills  [x]   Frustration tolerance/optimism  []   Children or pets in the home  []   Sense of responsibility to family  [x]   Agrees to treatment plan and follow up    Others (list):    High Risk Diagnoses (select all that apply):  []   Depression/Bipolar Disorder  [x]   Dual Diagnosis  []   Cardiovascular Disease  []   Schizophrenia  []   Chronic Pain  []   Epilepsy  []   Cancer  []   Personality Disorder  []   HIV/AIDS  []   Multiple Sclerosis    Dangerousness Assessment (Suicide, homicide, property destruction...)    Risk Factors reviewed and risk assessed to be:  []  low   [x]  low-moderate   []  moderate   []  moderate-high   []  high     Protection factors reviewed and risk assessed to be:  []  low   [x]  low-moderate   []  moderate   []  moderate-high   []  high     Response to treatment and risk assessed to be:  []  low   [x]  low-moderate   []  moderate   []  moderate-high   []  high     Support reviewed and risk assessed to be:  [x]  low   []  low-moderate   []  moderate   []  moderate-high   []  high     Acceptance of Discharge and outpatient treatment reviewed and risk assessed to be:    [x]  low   []  low-moderate   []  moderate   []  moderate-high   []  high   Overall risk assessed to be:  []  low   [x]  low-moderate   []  moderate   []  moderate-high   []  high     Completion of discharge was greater than 30 minutes. Over 50% of today's discharge was geared towards counseling and coordination of care.          Ronnald Collum, MD  Psychiatry  [facility]

## 2016-10-14 ENCOUNTER — Telehealth (INDEPENDENT_AMBULATORY_CARE_PROVIDER_SITE_OTHER): Payer: Self-pay | Admitting: Orthopaedic Surgery

## 2016-10-14 NOTE — Telephone Encounter (Signed)
Please advise 

## 2016-10-14 NOTE — Telephone Encounter (Signed)
Patient called wanted to speak with Dr. Cleophas Dunker about the surgery he did on him in October of 2016.  CB#[phone].  Thank you.

## 2016-11-04 ENCOUNTER — Telehealth (INDEPENDENT_AMBULATORY_CARE_PROVIDER_SITE_OTHER): Payer: Self-pay | Admitting: Orthopaedic Surgery

## 2016-11-04 NOTE — Telephone Encounter (Signed)
[facility]IN[facility] [phone]

## 2017-04-10 IMAGING — CT CT 3D INDEPENDENT WKST
3 of 4 series · 14 of 33 positions shown, 17 images · non-contrast
Comparison: None.

CLINICAL DATA: Right ankle pain. Possible fracture. Injured playing
basketball 04/03/2015. Nonspecific (abnormal) findings on
radiological and other examination of musculoskeletal sysem.

EXAM:
CT OF THE RIGHT ANKLE WITHOUT CONTRAST ; THREE-DIMENSIONAL CT
IMAGING RENDERING ON INDEPENDENT WORKSTATION
TECHNIQUE: Multidetector CT imaging of the right ankle was performed according
to the standard protocol. Multiplanar CT image reconstructions were
also generated.

[Series 4: soft tissue lower extremity · axial · 0.40mm/px · z∈[-248,-72]mm · 8 of 106 slices shown, 10 images]
[im 9/106  soft-tissue]
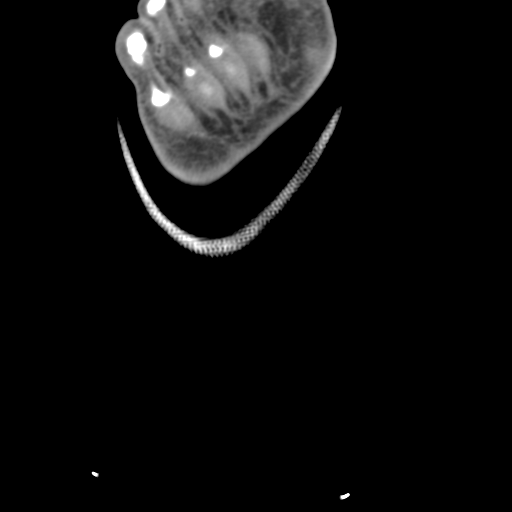
[im 9/106  bone]
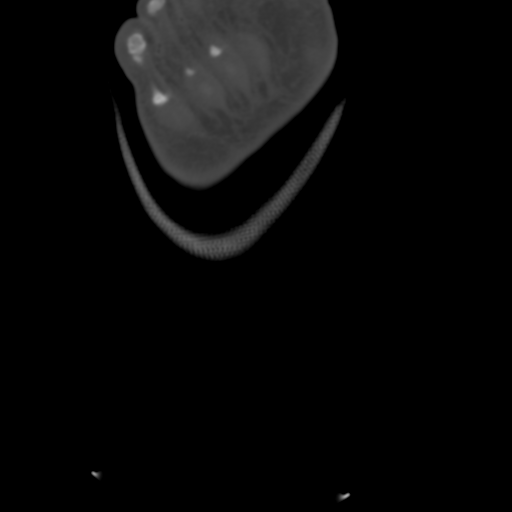
[im 25/106  bone]
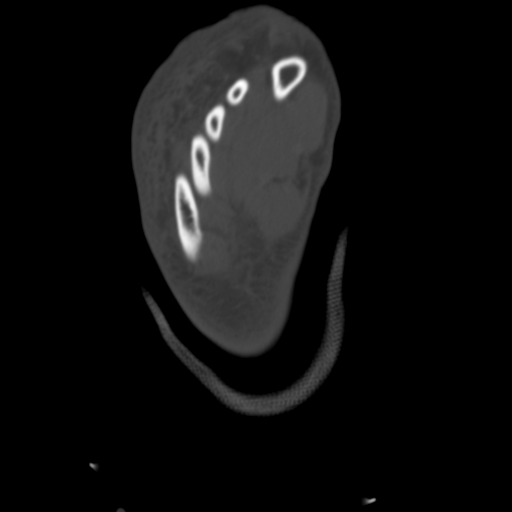
[im 33/106  bone]
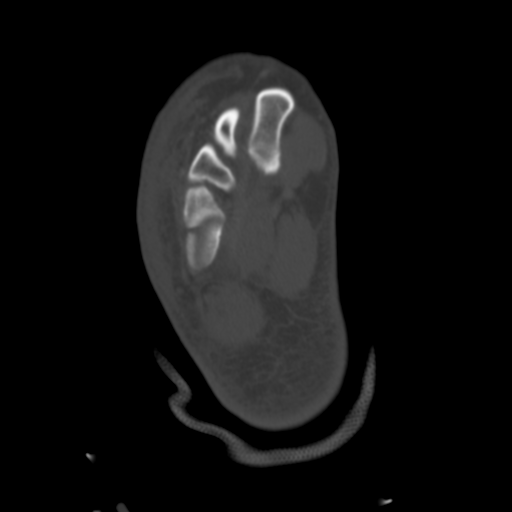
[im 49/106  bone]
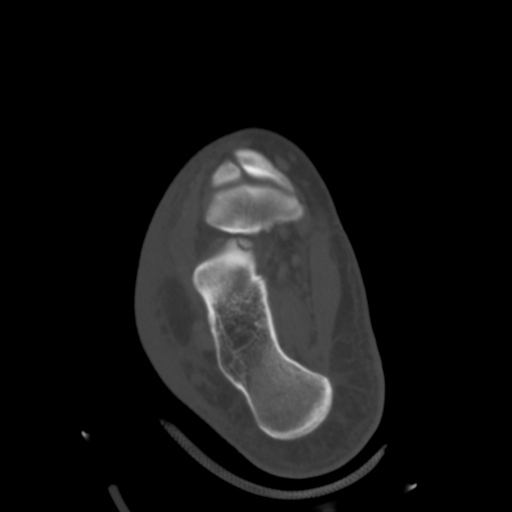
[im 57/106  soft-tissue]
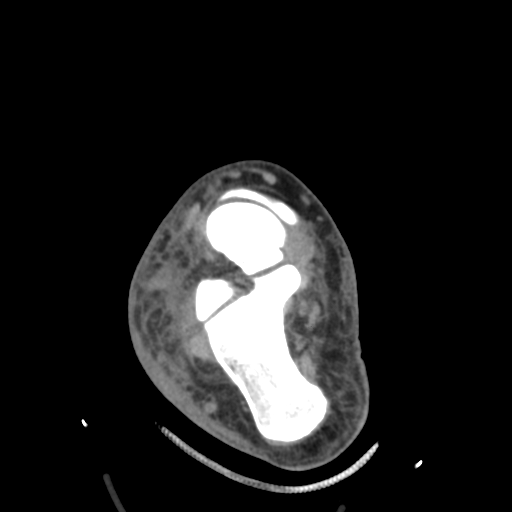
[im 57/106  bone]
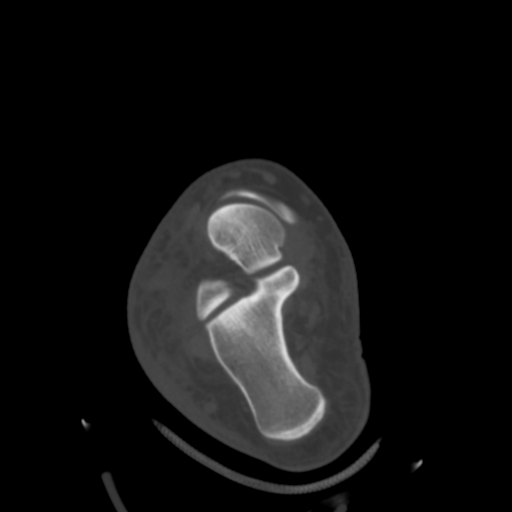
[im 73/106  bone]
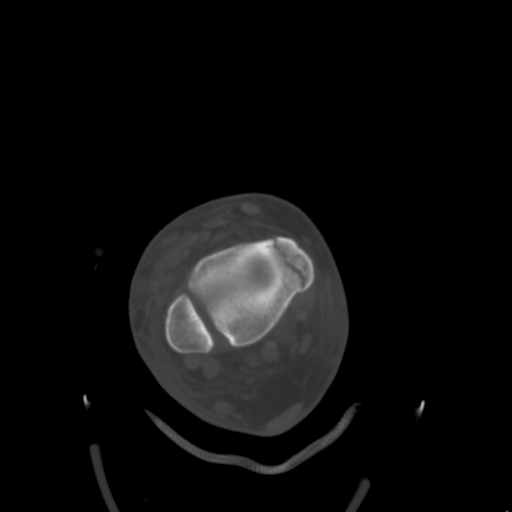
[im 81/106  bone]
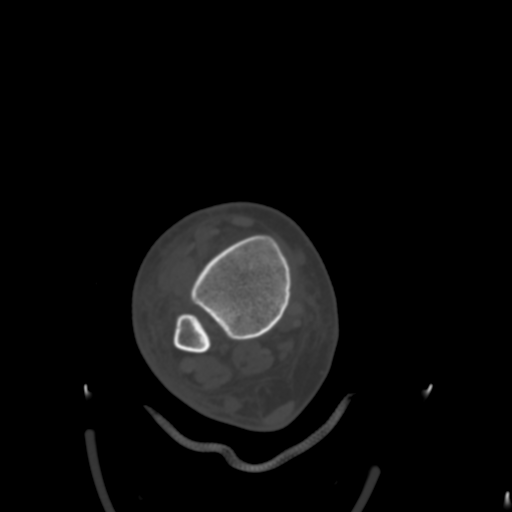
[im 97/106  bone]
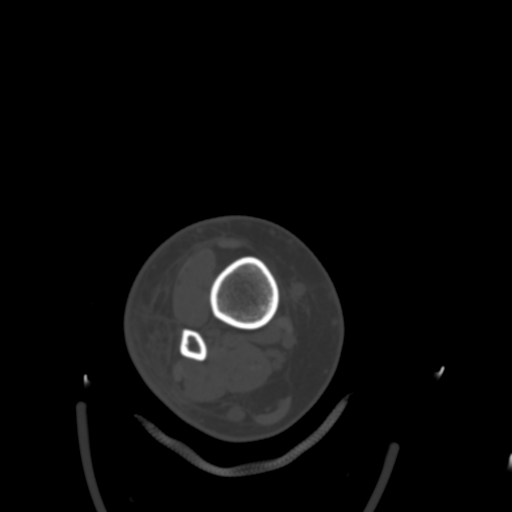

[Series 7: cor bone · coronal · 0.19mm/px · 1 of 59 slices shown]
[im 30/59  bone]
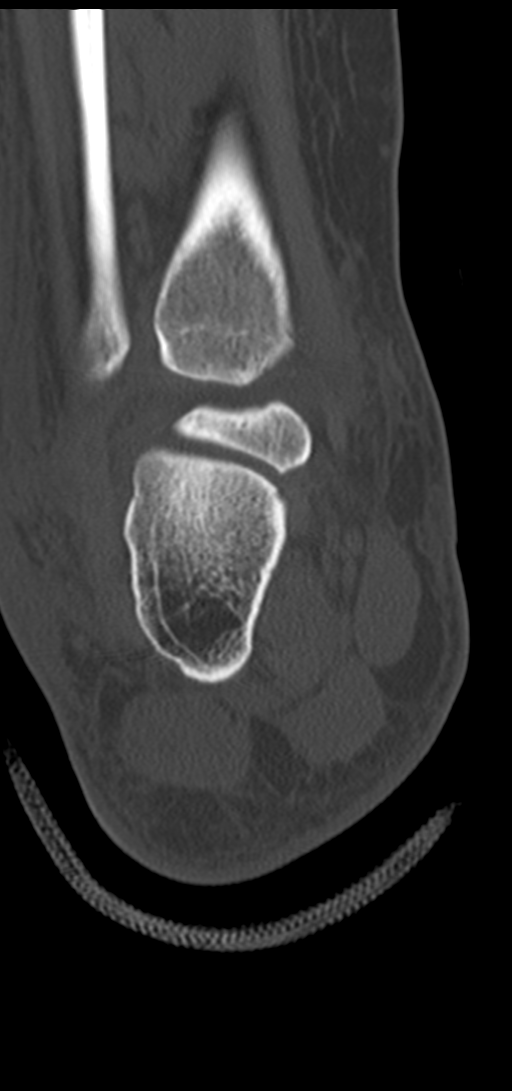

[Series 10: sagsoft tissue · sagittal · 0.35mm/px · 5 of 46 slices shown, 6 images]
[im 16/46  bone]
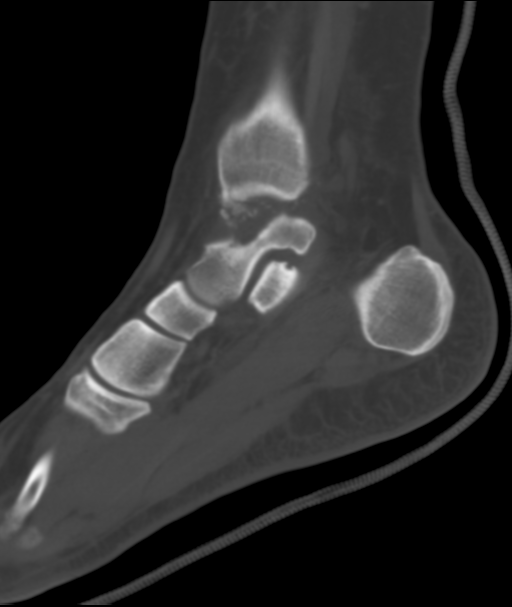
[im 19/46  bone]
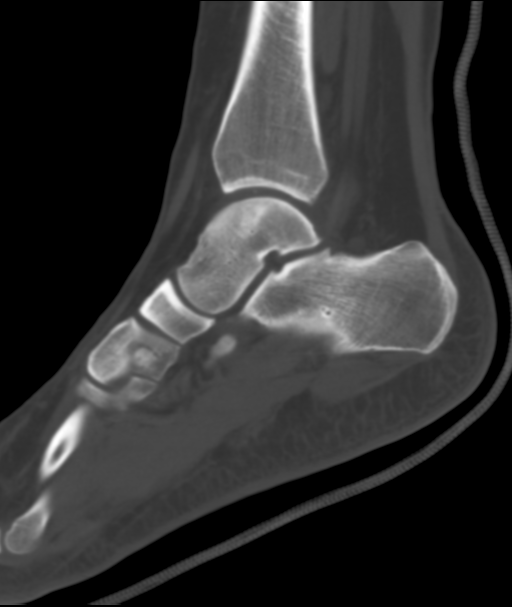
[im 23/46  soft-tissue]
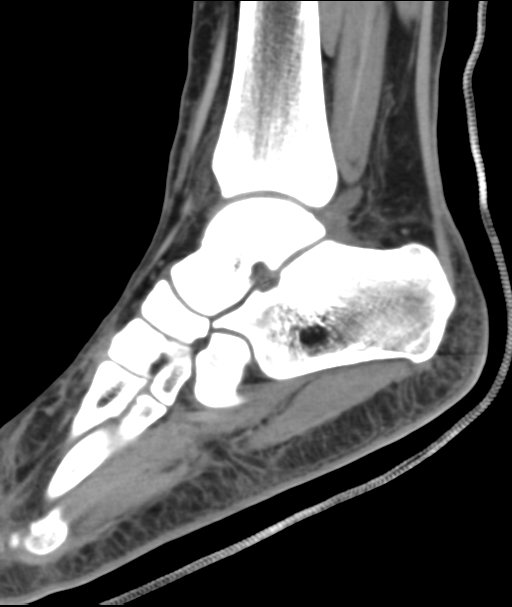
[im 23/46  bone]
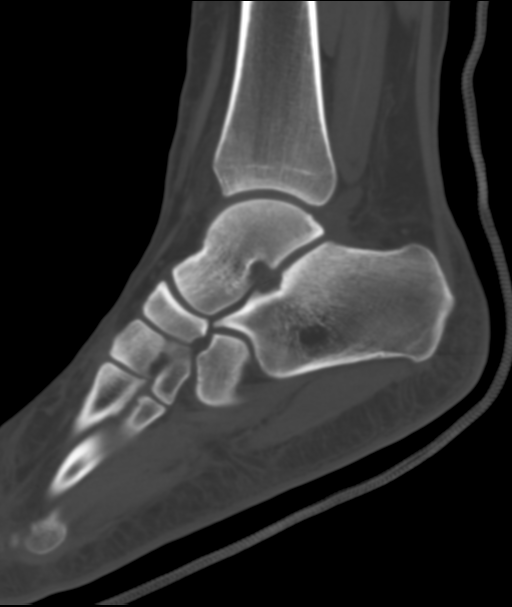
[im 27/46  bone]
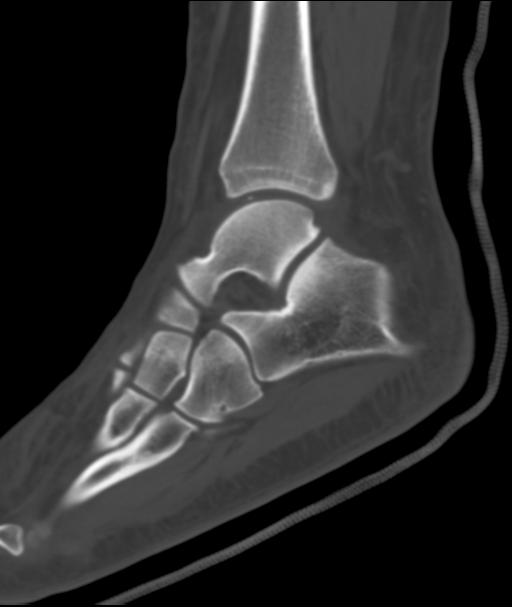
[im 31/46  bone]
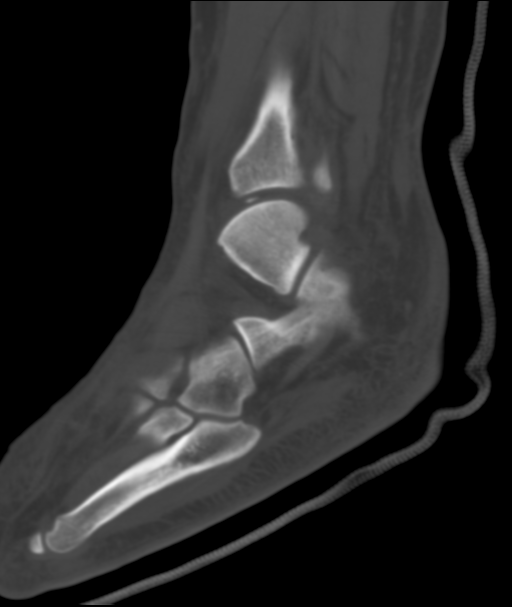

[14 of 33 positions shown; findings below may reference images not displayed]

FINDINGS: Oblique comminuted fracture of the medial malleolus with moderate
fragmentation of multiple small bony fragments scattered in the
tibiotalar joint especially anteriorly I do not see a definite
lateral malleolar fracture but there is a tiny calcification along
the region of the torn anterior talofibular ligament on image 46
series 4. Inferior tibiofibular ligaments are probably intact. Edema
along the expected course of the calcaneofibular ligament.

There is a 0.7 by 0.3 by 0.4 cm chronic appearing osteochondral
fragment of the medial portion of the anterior process of the
calcaneus, image 27 series 8. This is also well shown on image 58
series 3. I doubt that this is acute given its well corticated
appearance.

Small os peroneus.

There is subcutaneous edema both medially and laterally. Dorsal
subcutaneous edema tracks into the foot.
IMPRESSION: 1. Oblique comminuted fracture of the medial malleolus with moderate
fragmentation of multiple small fragments loose within the
tibiotalar joint.
2. Torn anterior talofibular ligament, possibly with a tiny
avulsion. Indeterminate calcaneofibular ligament.
3. Chronic appearing osteochondral fragment along the medial portion
of the anterior process of the calcaneus, not displaced.
4. Soft tissue swelling along both malleoli and tracking along the
dorsum of the foot.

## 2017-05-07 NOTE — ED Notes (Signed)
Formatting of this note might be different from the original.  Pt sleeping. No complaints at this time. Awaiting disposition.    Marilynne HalstedCook, Majken, RN  05/07/17 1020    Electronically signed by Marilynne Halstedook, Majken, RN at 05/07/2017 10:20 AM EST

## 2017-05-07 NOTE — ED Notes (Signed)
Formatting of this note might be different from the original.  Pt back from x-ray. Resting comfortably.    Marilynne HalstedCook, Majken, RN  05/07/17 [phone]    Electronically signed by Marilynne Halstedook, Majken, RN at 05/07/2017  8:29 AM EST

## 2017-05-07 NOTE — ED Provider Notes (Signed)
Formatting of this note is different from the original.    History     Chief Complaint   Patient presents with   ? Drug / Alcohol Assessment     Pt arrives wanting detox from weed. Pt states smoking three blunts per day. Pt denies SI/HI      HPI    21 y/o smoker w/ pmh of daily marijuana use and reported ankle operation 2 years ago presents to the ED w/ request for marijuana detox and right ankle evaluation. Patient states he's been smoking marijuana daily for the last 5 years. Last use "sometime" this morning pta. Denies any ankle pain just that it feels "weird" and that he may have been tricked when it was operated on 2 years ago.    Besides marijuana and cigarettes patient denies any symptoms.     No fevers, nausea, vomiting, syncope, SI    No past medical history on file.    No past surgical history on file.    No family history on file.    Social History   Substance Use Topics   ? Smoking status: Current Every Day Smoker     Types: Cigarettes   ? Smokeless tobacco: Current User      Comment: 3 cigs a day    ? Alcohol use Not on file     Review of Systems   Reason unable to perform ROS: caveat - with recent marijuana use    All other systems reviewed and are negative.    Physical Exam     Triage Vital Signs [05/07/17 0522]   BP Pulse Resp Temp SpO2   120/86 89 16 97.3 F (36.3 C) 98 %     Physical Exam   Constitutional: He is oriented to person, place, and time. He appears well-developed and well-nourished. No distress.   HENT:   Head: Normocephalic.   Eyes: EOM are normal. Right eye exhibits no discharge. Left eye exhibits no discharge. No scleral icterus.   4mm reactive to light bilat, mild bilat injection    Neck: Normal range of motion.   Cardiovascular: Normal rate and regular rhythm.    Pulmonary/Chest: Effort normal and breath sounds normal.   Abdominal: Soft. There is no tenderness.   Musculoskeletal: Normal range of motion.   Right ankle w/ raised side on right malleolus, non tender to touch, w/  comfortable ROM and ambulation. 2+ pulses +sensation    Neurological: He is alert and oriented to person, place, and time.   Skin: Skin is warm. He is not diaphoretic.   Psychiatric:   Odd affect     ED Course   Procedures  MDM  Number of Diagnoses or Management Options  Chronic pain of right ankle:   Marijuana abuse:   Diagnosis management comments: 21 y/o w/ hx of marijuana abuse and ?right ankle surgery presents with right ankle discomfort and marijuana detox.  Used marijuana prior to arrival, endorses tobacco use but denies SI/HI, co-ingestions or other illicit drug use. On re-evaluation with attending, patient well appearing, reports coming to [place] to meet someone which didn't work out and overall just wanted to sleep here.  Patient is well appearing, will discharge with information on drop in shelters    Notes as of May 07 753   Wed May 07, 2017   [id number] X-Ray Ankle Complete - Right [DB]   [id number] X-Ray Ankle Complete - Right [DB]     Notes User Index  [DB] Joneen BoersBremang, Derrick, MD  Clinical Calculator Scores       Joneen BoersBremang, Derrick, MD  Resident  05/07/17 [phone]    Electronically signed by Joneen BoersBremang, Derrick, MD at 05/07/2017 11:04 AM EST

## 2017-05-07 NOTE — Unmapped (Signed)
Formatting of this note is different from the original.  [name] My findings are:    Hx:  Harry Nelson is a 21 y.o. male with a history of marijuana abuse who presents to the ED with complaints of chronic right ankle issues for the past two years and requests for detox from marijuana. The patient reports a history of right ankle operation two years ago, unknown of hardware placement. Since the operation, he complains of an odd sensation of his ankle but denies pain to the ankle or abnormal gait. Notably, the patient admits to smoking marijuana for the past 5 years, 3 blunts per day, last use yesterday. Otherwise, he reports feeling tricked into signing a contract regarding his ankle surgery and reports a week stay at a hospital for suspected psychiatric observation. He complains of no acute complaints and appears intoxicated during evaluation. He denies SI/HI or illicit drug use. He states he is originally from [place] and was visiting a friend in [place] who refused to answer their phone ad thus has no where to go. He is asymptomatic at evaluation and is requesting to stay here in the ED for a week.    He denies any other drug use aside from marijuana and denies any alcohol use.     ROS:  Resident note states unable to perform due to marijuana use, however, patient is able to give me reliable history.   Constitutional: Negative fever, chills, generalized weakness.  HENT: Negative sore throat.    Eyes: Negative visual changes.  Respiratory: Negative cough, shortness of breath.   Cardiovascular: Negative chest pain.  Gastrointestinal: Negative nausea, vomiting, diarrhea, abdominal pain.  Genitourinary: Negative dysuria, hematuria, urgency, frequency, flank pain.   Musculoskeletal: Positive chronic right ankle pain.   Skin: Negative rash.   Neurological: Negative dizziness, headaches, focal weakness.   Psych: Positive drug use (marijuana). Denies suicidal or homicidal ideation.      PE:  Triage Vital Signs [05/07/17 0522]   BP Pulse Resp Temp SpO2   120/86 89 16 97.3 F (36.3 C) 98 %     General: Patient is lying on back in stretcher, noted to be asleep upon arrival to examination room, awakens to my voice. He is well appearing and in no acute distress.   Neuro: Normal level of consciousness, moving all extremities symmetrically with 5/5 muscle strength, there is no focal neurologic deficit.  HEENT: No sign of trauma to the head or face.  Oral mucosa is pink and moist.  No scleral icterus. There is mild scleral injection bilaterally.   Neck: Trachea is midline.  There is no sign of trauma to the neck.  No midline cervical spine tenderness to palpation.  Chest: Lungs with clear and equal breath sounds bilaterally.  Respirations nonlabored.  No accessory muscle use or retractions noted.  Oxygen saturation is 98% on room air at triage.  Heart: Triage vital signs with a heart rate of 89 bpm, BP 120/86, no murmur appreciated.   Abdomen: Soft, nondistended, nontender to palpation there, there is no guarding, rigidity, or rebound tenderness to palpation.  Back: No midline thoracic or lumbar spine tenderness to palpation.  Extremities: No gross deformities or signs of trauma.  There is a healed scar to the medial aspect of the right ankle with a chronic appearing bony abnormality noted. No tenderness to palpation. No overlying skin changes to suggest infection. Good range of motion of right ankle. Strong DP pulse, brisk capillary refill.  No pedal edema.  No calf swelling, erythema, or tenderness palpation.  Skin: Dry, no diaphoresis, no pallor, no jaundice.  Psych: Flat affect, calm and cooperative, no signs of acute psychosis.     Medical decision-making:  Patient is a 21 year old male presenting to the emergency department for evaluation of right ankle pain as well and is requesting detox from marijuana.  Patient drove here from [place] where he resides in order to meet a male friend,  however, he drove here overnight and the friend has since not picked up her phone.  He is unfamiliar with the area.  He is requesting to stay in the emergency department for several days.  He reports right ankle pain for the past 2 years since he fractured it.  He reports having orthopedic surgery with hardware.  He has had pain since the surgery, however, no new trauma or falls.    His right ankle is not significantly swollen and there are no signs of trauma.  He has a healed surgical scar.  An x-ray of the ankle was obtained showing an old medial malleolus fracture with a pin in place and early osteoarthritis, otherwise, no acute findings.    Patient smokes 3 "blunts" of marijuana daily, he reports his last use was overnight.  On initial evaluation, he appears intoxicated on marijuana.  He denies any other drug use or alcohol use.  He is intermittently falling asleep during exam.    He drove to the emergency room.  He reports to me that he plan on seeing in [place] for the next week and then returning home to [place].    Patient observed in the emergency department for 5 hours, noted to be sleeping the entire time, but easily arousable, on reevaluation does not appear intoxicated and has no acute complaints.    I discussed with patient regarding options upon discharge from the ER, he again requests to stay overnight in the ED, however, I informed him that I do not have an indication to keep him in the emergency room nor admit him.  He acknowledges the option to drive back to [place] at this time to return home as he does not know anyone else in [place] and has nowhere else to stay.  I will provide him with a list of a drop in shelter and soup kitchens in the event that he desires to use these facilities while in [place].     At this time, patient is stable for discharge.     I have reviewed the Resident's assessment and plan.     I was present during they key critical portions of the procedure(s) documented  by the Resident and was immediately available to furnish service during the entire process.    Seen and evaluated by me at 8:54 AM  All labs interpreted by me.    "By signing my name below, I, ANSLIP, ABEID, attest that this documentation has been prepared under the direction and in the presence of Charlynn GrimesNatalie Hubbard, DO."     Electronically Signed: ANSLIP, ABEID. 05/07/2017. 6:09 AM.    "Sallyanne HaversI,Natalie L Hubbard, DO, personally performed the services described in this documentation. All medical record entries made by the scribe, Abeid Anslip, were at my direction and in my presence. I have reviewed the chart and discharge instructions (if applicable) and agree that the record reflects my personal performance and is accurate and complete."    Nathen MayNatalie L Hubbard, DO. 05/12/2017. 5:16 PM.      Charlynn GrimesHubbard, Natalie  Teresa Coombs, DO  05/12/17 1716    Electronically signed by Jeannetta Ellis, DO at 05/12/2017  5:16 PM EST

## 2017-05-07 NOTE — ED Notes (Signed)
Formatting of this note might be different from the original.  Pt arrived to the ED via external triage seeking detox from marijuana. Pt denies any other drug use or alcohol use, smokes cigarettes, denies any other medical/surgical hx, pt is alert and oriented, vss, NAD.     Tempie DonningHoward, Madeline, RN  05/07/17 [phone]    Electronically signed by Tempie DonningHoward, Madeline, RN at 05/07/2017  5:35 AM EST

## 2017-11-28 ENCOUNTER — Inpatient Hospital Stay
Admit: 2017-11-28 | Discharge: 2017-11-28 | Disposition: A | Discharge status: Other Acute Inpatient Hospital | Payer: MEDICAID | Attending: Emergency Medicine

## 2017-11-28 DIAGNOSIS — F23 Brief psychotic disorder: Secondary | ICD-10-CM

## 2017-11-28 LAB — COMPREHENSIVE METABOLIC PANEL
ALT: 29 U/L (ref 16–61)
AST: 21 U/L (ref 15–37)
Albumin/Globulin Ratio: 1.3 (ref 0.8–1.7)
Albumin: 3.9 g/dL (ref 3.4–5.0)
Alkaline Phosphatase: 98 U/L (ref 45–117)
Anion Gap: 6 mmol/L (ref 3.0–18)
BUN: 14 MG/DL (ref 7.0–18)
Bun/Cre Ratio: 12 (ref 12–20)
CO2: 29 mmol/L (ref 21–32)
Calcium: 9 MG/DL (ref 8.5–10.1)
Chloride: 107 mmol/L (ref 100–108)
Creatinine: 1.13 MG/DL (ref 0.6–1.3)
EGFR IF NonAfrican American: >60 mL/min/{1.73_m2} (ref >60)
GFR African American: >60 mL/min/{1.73_m2} (ref >60)
Globulin: 2.9 g/dL (ref 2.0–4.0)
Glucose: 99 mg/dL (ref 74–99)
Potassium: 3.6 mmol/L (ref 3.5–5.5)
Sodium: 142 mmol/L (ref 136–145)
Total Bilirubin: 0.3 MG/DL (ref 0.2–1.0)
Total Protein: 6.8 g/dL (ref 6.4–8.2)

## 2017-11-28 LAB — CBC WITH AUTO DIFFERENTIAL
Basophils %: 0 % (ref 0–2)
Basophils Absolute: 0 10*3/uL (ref 0.0–0.1)
Eosinophils %: 2 % (ref 0–5)
Eosinophils Absolute: 0.2 10*3/uL (ref 0.0–0.4)
Hematocrit: 40.9 % (ref 36.0–48.0)
Hemoglobin: 14.6 g/dL (ref 13.0–16.0)
Lymphocytes %: 52 % (ref 21–52)
Lymphocytes Absolute: 3.4 10*3/uL (ref 0.9–3.6)
MCH: 29 PG (ref 24.0–34.0)
MCHC: 35.7 g/dL (ref 31.0–37.0)
MCV: 81.3 FL (ref 74.0–97.0)
MPV: 10.5 FL (ref 9.2–11.8)
Monocytes %: 8 % (ref 3–10)
Monocytes Absolute: 0.5 10*3/uL (ref 0.05–1.2)
Neutrophils %: 38 % — ABNORMAL LOW (ref 40–73)
Neutrophils Absolute: 2.5 10*3/uL (ref 1.8–8.0)
Platelets: 195 10*3/uL (ref 135–420)
RBC: 5.03 M/uL (ref 4.70–5.50)
RDW: 12.5 % (ref 11.6–14.5)
WBC: 6.6 10*3/uL (ref 4.6–13.2)

## 2017-11-28 LAB — DRUG SCREEN, URINE
AMPHETAMINES: NEGATIVE
Amphetamine Screen, Urine: NEGATIVE
BARBITURATES: NEGATIVE
BENZODIAZEPINES: NEGATIVE
Barbiturate Screen, Urine: NEGATIVE
Benzodiazepine Screen, Urine: NEGATIVE
COCAINE: NEGATIVE
Cocaine Screen Urine: NEGATIVE
METHADONE: NEGATIVE
Methadone Screen, Urine: NEGATIVE
OPIATES: NEGATIVE
Opiate Screen, Urine: NEGATIVE
PCP Screen, Urine: NEGATIVE
PCP(PHENCYCLIDINE): NEGATIVE
THC (TH-CANNABINOL): NEGATIVE
THC Screen, Urine: NEGATIVE

## 2017-11-28 LAB — ACETAMINOPHEN LEVEL: Acetaminophen Level: <2 ug/mL — ABNORMAL LOW (ref 10.0–30.0)

## 2017-11-28 LAB — SALICYLATE
Salicylate level: <1.7 MG/DL — ABNORMAL LOW (ref 2.8–20.0)
Salicylate: <1.7 MG/DL — ABNORMAL LOW (ref 2.8–20.0)

## 2017-11-28 LAB — ETHYL ALCOHOL
ALCOHOL(ETHYL),SERUM: <3 MG/DL (ref 0–3)
Ethyl Alcohol: <3 MG/DL (ref 0–3)

## 2017-11-28 LAB — CBC WITH AUTOMATED DIFF
ABS. BASOPHILS: 0 10*3/uL (ref 0.0–0.1)
ABS. EOSINOPHILS: 0.2 10*3/uL (ref 0.0–0.4)
ABS. LYMPHOCYTES: 3.4 10*3/uL (ref 0.9–3.6)
ABS. MONOCYTES: 0.5 10*3/uL (ref 0.05–1.2)
ABS. NEUTROPHILS: 2.5 10*3/uL (ref 1.8–8.0)
BASOPHILS: 0 % (ref 0–2)
EOSINOPHILS: 2 % (ref 0–5)
HCT: 40.9 % (ref 36.0–48.0)
HGB: 14.6 g/dL (ref 13.0–16.0)
LYMPHOCYTES: 52 % (ref 21–52)
MCH: 29 PG (ref 24.0–34.0)
MCHC: 35.7 g/dL (ref 31.0–37.0)
MCV: 81.3 FL (ref 74.0–97.0)
MONOCYTES: 8 % (ref 3–10)
MPV: 10.5 FL (ref 9.2–11.8)
NEUTROPHILS: 38 % — ABNORMAL LOW (ref 40–73)
PLATELET: 195 10*3/uL (ref 135–420)
RBC: 5.03 M/uL (ref 4.70–5.50)
RDW: 12.5 % (ref 11.6–14.5)
WBC: 6.6 10*3/uL (ref 4.6–13.2)

## 2017-11-28 LAB — METABOLIC PANEL, COMPREHENSIVE
A-G Ratio: 1.3 (ref 0.8–1.7)
ALT (SGPT): 29 U/L (ref 16–61)
AST (SGOT): 21 U/L (ref 15–37)
Albumin: 3.9 g/dL (ref 3.4–5.0)
Alk. phosphatase: 98 U/L (ref 45–117)
Anion gap: 6 mmol/L (ref 3.0–18)
BUN/Creatinine ratio: 12 (ref 12–20)
BUN: 14 MG/DL (ref 7.0–18)
Bilirubin, total: 0.3 MG/DL (ref 0.2–1.0)
CO2: 29 mmol/L (ref 21–32)
Calcium: 9 MG/DL (ref 8.5–10.1)
Chloride: 107 mmol/L (ref 100–108)
Creatinine: 1.13 MG/DL (ref 0.6–1.3)
GFR est AA: >60 mL/min/{1.73_m2} (ref >60)
GFR est non-AA: >60 mL/min/{1.73_m2} (ref >60)
Globulin: 2.9 g/dL (ref 2.0–4.0)
Glucose: 99 mg/dL (ref 74–99)
Potassium: 3.6 mmol/L (ref 3.5–5.5)
Protein, total: 6.8 g/dL (ref 6.4–8.2)
Sodium: 142 mmol/L (ref 136–145)

## 2017-11-28 LAB — ACETAMINOPHEN: Acetaminophen level: <2 ug/mL — ABNORMAL LOW (ref 10.0–30.0)

## 2017-11-28 MED ORDER — LORAZEPAM 2 MG/ML IJ SOLN
2 mg/mL | INTRAMUSCULAR | Status: AC
Start: 2017-11-28 — End: 2017-11-28
  Administered 2017-11-28: 09:00:00 via INTRAVENOUS

## 2017-11-28 MED ORDER — LORAZEPAM 1 MG TAB
1 mg | ORAL | Status: DC
Start: 2017-11-28 — End: 2017-11-28
  Administered 2017-11-28: 05:00:00 via ORAL

## 2017-11-28 MED FILL — LORAZEPAM 2 MG/ML IJ SOLN: 2 mg/mL | INTRAMUSCULAR | Qty: 1

## 2017-11-28 NOTE — ED Notes (Signed)
Pt accepted by [facility]   Accepting Dr. Ryan Wade  Report #: [phone]

## 2017-11-28 NOTE — ED Triage Notes (Signed)
Pt states he is HI/SI. Pt states he is hearing voices and hallucinating. Pt doesn't answer questions appropriately. Pt is alert.

## 2017-11-28 NOTE — ED Provider Notes (Signed)
EMERGENCY DEPARTMENT HISTORY AND PHYSICAL EXAM  This was created with voice recognition software and transcription errors may be present.     1:15 AM  Date: 11/28/2017  Patient Name: Harry Nelson    History of Presenting Illness     Chief Complaint:    History Provided By:     HPI: Harry Nelson is a 22 y.o. male who presents with homicidal/suicidal patient is awake and alert without complaint.  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    PCP: Rosealee AlbeeHarewood, Dionne N, MD      Past History     Past Medical History:  History reviewed. No pertinent past medical history.    Past Surgical History:  History reviewed. No pertinent surgical history.    Family History:  Family History   Problem Relation Age of Onset   ??? No Known Problems Mother    ??? No Known Problems Father    ??? No Known Problems Sister    ??? No Known Problems Brother    ??? No Known Problems Maternal Aunt    ??? No Known Problems Sister        Social History:  Social History     Tobacco Use   ??? Smoking status: Never Smoker   ??? Smokeless tobacco: Never Used   Substance Use Topics   ??? Alcohol use: No   ??? Drug use: No       Allergies:  No Known Allergies    Review of Systems     Review of Systems   Psychiatric/Behavioral: Positive for confusion and hallucinations. Negative for agitation.   All other systems reviewed and are negative.    10 point review of systems otherwise negative unless noted in HPI.    Physical Exam       Physical Exam   Constitutional: He is oriented to person, place, and time. He appears well-developed.   HENT:   Head: Normocephalic and atraumatic.   Eyes: Pupils are equal, round, and reactive to light. EOM are normal.   Neck: Normal range of motion. Neck supple.   Cardiovascular: Normal rate, regular rhythm and normal heart sounds. Exam reveals no friction rub.   No murmur heard.  Pulmonary/Chest: Effort normal and breath sounds normal. No respiratory  distress. He has no wheezes.   Abdominal: Soft. He exhibits no distension. There is no tenderness. There is no rebound and no guarding.   Musculoskeletal: Normal range of motion.   Neurological: He is alert and oriented to person, place, and time.   Skin: Skin is warm and dry.   Psychiatric: He has a normal mood and affect. His behavior is normal. Thought content normal.       Diagnostic Study Results     Vital Signs. There were no vitals taken for this visit.   EKG:   Labs:   Imaging:     Medical Decision Making     ED Course: Progress Notes, Reevaluation, and Consults:      Provider Notes (Medical Decision Making):   22 year old male presents with homicidal suicidal ideation and will be an ECO/TDO         Diagnosis     Clinical Impression: No diagnosis found.  Disposition:    Patient's Medications    No medications on file

## 2017-11-28 NOTE — ED Notes (Signed)
Patient transferred to [facility]. All belongings sent with the patient. Pt accompanied by officers. Pt left in no distress.

## 2017-11-28 NOTE — ED Notes (Signed)
Report called to Sade Graham RN at [facility].

## 2017-11-28 NOTE — ED Notes (Signed)
Attempted to call report at [facility], no answer.

## 2017-11-28 NOTE — ED Notes (Signed)
Attempted to call report at [facility], no answer.

## 2017-11-28 NOTE — ED Notes (Signed)
Pt states he is HI/SI. Pt states he is hearing voices and hallucinating. Pt doesn't answer questions appropriately. Pt is alert.

## 2017-11-28 NOTE — ED Notes (Signed)
Pt accepted by [facility]   Accepting Dr. Valeda Malm  Report #: [phone]

## 2017-11-28 NOTE — ED Provider Notes (Signed)
EMERGENCY DEPARTMENT HISTORY AND PHYSICAL EXAM  This was created with voice recognition software and transcription errors may be present.     1:15 AM  Date: 11/28/2017  [facility]    History of Presenting Illness     Chief Complaint:    History Provided By:     HPI: [name] is a 22 y.o. male who presents with homicidal/suicidal patient is awake and alert without complaint.  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    PCP: Rosealee AlbeeHarewood, Dionne N, MD      Past History     Past Medical History:  History reviewed. No pertinent past medical history.    Past Surgical History:  History reviewed. No pertinent surgical history.    Family History:  Family History   Problem Relation Age of Onset   ??? No Known Problems Mother    ??? No Known Problems Father    ??? No Known Problems Sister    ??? No Known Problems Brother    ??? No Known Problems Maternal Aunt    ??? No Known Problems Sister        Social History:  Social History     Tobacco Use   ??? Smoking status: Never Smoker   ??? Smokeless tobacco: Never Used   Substance Use Topics   ??? Alcohol use: No   ??? Drug use: No       Allergies:  No Known Allergies    Review of Systems     Review of Systems   Psychiatric/Behavioral: Positive for confusion and hallucinations. Negative for agitation.   All other systems reviewed and are negative.    10 point review of systems otherwise negative unless noted in HPI.    Physical Exam       Physical Exam   Constitutional: He is oriented to person, place, and time. He appears well-developed.   HENT:   Head: Normocephalic and atraumatic.   Eyes: Pupils are equal, round, and reactive to light. EOM are normal.   Neck: Normal range of motion. Neck supple.   Cardiovascular: Normal rate, regular rhythm and normal heart sounds. Exam reveals no friction rub.   No murmur heard.  Pulmonary/Chest: Effort normal and breath sounds normal. No respiratory distress.  He has no wheezes.   Abdominal: Soft. He exhibits no distension. There is no tenderness. There is no rebound and no guarding.   Musculoskeletal: Normal range of motion.   Neurological: He is alert and oriented to person, place, and time.   Skin: Skin is warm and dry.   Psychiatric: He has a normal mood and affect. His behavior is normal. Thought content normal.       Diagnostic Study Results     Vital Signs. There were no vitals taken for this visit.   EKG:   Labs:   Imaging:     Medical Decision Making     ED Course: Progress Notes, Reevaluation, and Consults:      Provider Notes (Medical Decision Making):   22 year old male presents with homicidal suicidal ideation and will be an ECO/TDO         Diagnosis     Clinical Impression: No diagnosis found.    Disposition:    Patient's Medications    No medications on file

## 2017-11-28 NOTE — ED Notes (Signed)
Patient transferred to [facility]. All belongings sent with the patient. Pt accompanied by officers. Pt left in no distress.

## 2017-11-28 NOTE — ED Notes (Signed)
Report called to Hollie Beach RN at [facility].

## 2019-11-17 NOTE — Unmapped (Signed)
Formatting of this note is different from the original.   Hospital PES Bed Search    The following hospitals were contacted for patient bed status on 11/17/2019:    ? [place]   Time: 4:30PM Contact: Charmaine Bed Available: Yes will fax referral when tox is completed    Electronic Signature:  Charleston Ropes, MS, [facility], [facility]  11/17/2019, 4:53 PM    Electronically signed by Charleston Ropes at 11/17/2019  4:53 PM EDT

## 2019-11-17 NOTE — Unmapped (Signed)
Formatting of this note is different from the original.  Attending Note    I interviewed and examined the patient. I reviewed the Resident's HPI, review of symptoms, past history, physical exam and agree with the documented findings and plan of care. Any additions or clarifications to follow:    HPI:  In summary this is a 24 y.o. male who  has a past medical history of Schizophrenia. who presents with concern for psychosis. Pt has been off his meds for the past 2 weeks (Trazodone and Abilify). Pt unable to say why he stopped taking meds. Pt stole a car yesterday in [place], pt was brought in on an EP. No SI/HI, no AH/VH. Pt denies drugs. Pt has no other somatic complaints.     RELEVANT PE FINDINGS:  BP 138/85 (BP Location: Right arm, Patient Position: Sitting)   Pulse 100   Temp 36.7 C (98.1 F) (Temporal)   SpO2 97%     Gen: No distress, obese  Heart: RRR, no rubs/murmurs/gallops  Chest: CTA b/l; no wheezing/rhonchi  Abd: normal BS, soft, non-tender  Extr: 2+ radial pulses b/l; no LE edema  Neuro: A&O x3, moving all extremities w/o difficulty    ASSESSMENT AND PLAN: 24 year old male with a history of schizophrenia here with concern for psychosis in the setting of being off his medication for the past 2 weeks.  On exam, patient had borderline tachycardia initially.  Vital signs otherwise normal.  Exam otherwise unremarkable.    Ddx: Worsening of schizophrenia due to medication noncompliance.  Plan: Follow-up lab work, psych eval.    MDM:  I have reviewed some of the nursing records    Reviewed Studies:   I have reviewed relevant laboratory values.     HPI/ROS provided by:   [x]  Patient   []  Family   []  EMS   []  Friend   []  Caregiver   []  POA     3:00 PM  CBC, CMP reassuring. APAP, ASA, EtOH negative. Utox positive for cannabinoids. Signed out to Dr. . Spoke with Psych. Pt signed a voluntary. Plan: medically cleared, awaiting Psych placement.     Patient's care discussed with:   []  Primary Care Doc:     [x]   Consultant: Psych    []  Admitting Physician:        Electronically signed by , MD MPH at 11/18/2019  1:59 AM EDT

## 2019-11-17 NOTE — ED Notes (Signed)
Formatting of this note might be different from the original.  Assumed patient care from [facility]. Patient is sleeping on the recliner chair. No distress noted. Respiration even and unlabored. Will continue to monitor patient.  Electronically signed by Vicenta Dunning D at 11/17/2019  7:52 PM EDT
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
## 2019-11-17 NOTE — Unmapped (Signed)
Formatting of this note might be different from the original.  The pt has [place] and below is his medicaid contact information:  Medicaid #: [id number]  Member service number: [phone]  Electronically signed by Haroldine Laws, LCPC at 11/17/2019  1:25 PM EDT
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
## 2019-11-18 NOTE — ED Notes (Signed)
Formatting of this note might be different from the original.  Patient is sleeping. No distress noted. Respiration even and unlabored. Will continue to monitor patient.  Electronically signed by Vicenta Dunning D at 11/18/2019 12:30 AM EDT

## 2019-11-18 NOTE — ED Notes (Signed)
Formatting of this note might be different from the original.  Patient was transferred to [facility]. Patient remains calm and cooperative. Patient's belonging and valuables were endorsed to [facility] ambulance crew. Patient was placed on the ambulance stretcher and was escorted by security officer to ED ambo ramp.  Electronically signed by Vicenta Dunning D at 11/18/2019 12:39 AM EDT

## 2020-11-17 ENCOUNTER — Emergency Department

## 2020-11-17 DIAGNOSIS — F489 Nonpsychotic mental disorder, unspecified: Secondary | ICD-10-CM
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
## 2020-11-17 NOTE — ED Notes (Signed)
Pt calm and cooperative, denies SI nor HI. Not in resp distress.
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
## 2020-11-18 ENCOUNTER — Inpatient Hospital Stay
Admit: 2020-11-18 | Discharge: 2020-11-18 | Disposition: A | Discharge status: Home / Self Care | Payer: MEDICAID | Attending: Emergency Medicine

## 2020-11-18 LAB — CBC WITH AUTO DIFFERENTIAL
Basophils %: 0 % (ref 0–2)
Basophils Absolute: 0 10*3/uL (ref 0.0–0.1)
Eosinophils %: 2 % (ref 0–5)
Eosinophils Absolute: 0.2 10*3/uL (ref 0.0–0.4)
Granulocyte Absolute Count: 0 10*3/uL (ref 0.00–0.04)
Hematocrit: 40.2 % (ref 36.0–48.0)
Hemoglobin: 14 g/dL (ref 13.0–16.0)
Immature Granulocytes: 0 % (ref 0.0–0.5)
Lymphocytes %: 41 % (ref 21–52)
Lymphocytes Absolute: 4.5 10*3/uL — ABNORMAL HIGH (ref 0.9–3.6)
MCH: 29.5 PG (ref 24.0–34.0)
MCHC: 34.8 g/dL (ref 31.0–37.0)
MCV: 84.8 FL (ref 78.0–100.0)
MPV: 9.6 FL (ref 9.2–11.8)
Monocytes %: 7 % (ref 3–10)
Monocytes Absolute: 0.7 10*3/uL (ref 0.05–1.2)
NRBC Absolute: 0 10*3/uL (ref 0.00–0.01)
Neutrophils %: 49 % (ref 40–73)
Neutrophils Absolute: 5.4 10*3/uL (ref 1.8–8.0)
Nucleated RBCs: 0 PER 100 WBC
Platelets: 244 10*3/uL (ref 135–420)
RBC: 4.74 M/uL (ref 4.35–5.65)
RDW: 12.5 % (ref 11.6–14.5)
WBC: 10.9 10*3/uL (ref 4.6–13.2)

## 2020-11-18 LAB — COMPREHENSIVE METABOLIC PANEL
ALT: 21 U/L (ref 16–61)
AST: 13 U/L (ref 10–38)
Albumin/Globulin Ratio: 0.9 (ref 0.8–1.7)
Albumin: 3.5 g/dL (ref 3.4–5.0)
Alkaline Phosphatase: 101 U/L (ref 45–117)
Anion Gap: 4 mmol/L (ref 3.0–18)
BUN: 12 MG/DL (ref 7.0–18)
Bun/Cre Ratio: 14 (ref 12–20)
CO2: 28 mmol/L (ref 21–32)
Calcium: 9.1 MG/DL (ref 8.5–10.1)
Chloride: 109 mmol/L (ref 100–111)
Creatinine: 0.88 MG/DL (ref 0.6–1.3)
EGFR IF NonAfrican American: >60 mL/min/{1.73_m2} (ref >60)
GFR African American: >60 mL/min/{1.73_m2} (ref >60)
Globulin: 3.8 g/dL (ref 2.0–4.0)
Glucose: 92 mg/dL (ref 74–99)
Potassium: 3.8 mmol/L (ref 3.5–5.5)
Sodium: 141 mmol/L (ref 136–145)
Total Bilirubin: 0.3 MG/DL (ref 0.2–1.0)
Total Protein: 7.3 g/dL (ref 6.4–8.2)

## 2020-11-18 LAB — DRUG SCREEN, URINE
AMPHETAMINES: NEGATIVE
Amphetamine Screen, Urine: NEGATIVE
BARBITURATES: NEGATIVE
BENZODIAZEPINES: NEGATIVE
Barbiturate Screen, Urine: NEGATIVE
Benzodiazepine Screen, Urine: NEGATIVE
COCAINE: NEGATIVE
Cocaine Screen Urine: NEGATIVE
METHADONE: NEGATIVE
Methadone Screen, Urine: NEGATIVE
OPIATES: NEGATIVE
Opiate Screen, Urine: NEGATIVE
PCP Screen, Urine: NEGATIVE
PCP(PHENCYCLIDINE): NEGATIVE
THC (TH-CANNABINOL): POSITIVE — AB
THC Screen, Urine: POSITIVE — AB

## 2020-11-18 LAB — HEMOGLOBIN A1C
Estimated Avg Glucose, External: 115 mg/dL (ref 91–123)
Hemoglobin A1C, External: 5.7 % — ABNORMAL HIGH (ref 4.8–5.6)

## 2020-11-18 LAB — COVID-19 WITH INFLUENZA A/B
Influenza A By PCR: NOT DETECTED
Influenza A by PCR: NOT DETECTED
Influenza B By PCR: NOT DETECTED
Influenza B by PCR: NOT DETECTED
SARS-CoV-2 by PCR: NOT DETECTED
SARS-CoV-2: NOT DETECTED

## 2020-11-18 LAB — ETHYL ALCOHOL
ALCOHOL(ETHYL),SERUM: <3 MG/DL (ref 0–3)
Ethyl Alcohol: <3 MG/DL (ref 0–3)

## 2020-11-18 LAB — METABOLIC PANEL, COMPREHENSIVE
A-G Ratio: 0.9 (ref 0.8–1.7)
ALT (SGPT): 21 U/L (ref 16–61)
AST (SGOT): 13 U/L (ref 10–38)
Albumin: 3.5 g/dL (ref 3.4–5.0)
Alk. phosphatase: 101 U/L (ref 45–117)
Anion gap: 4 mmol/L (ref 3.0–18)
BUN/Creatinine ratio: 14 (ref 12–20)
BUN: 12 MG/DL (ref 7.0–18)
Bilirubin, total: 0.3 MG/DL (ref 0.2–1.0)
CO2: 28 mmol/L (ref 21–32)
Calcium: 9.1 MG/DL (ref 8.5–10.1)
Chloride: 109 mmol/L (ref 100–111)
Creatinine: 0.88 MG/DL (ref 0.6–1.3)
GFR est AA: >60 mL/min/{1.73_m2} (ref >60)
GFR est non-AA: >60 mL/min/{1.73_m2} (ref >60)
Globulin: 3.8 g/dL (ref 2.0–4.0)
Glucose: 92 mg/dL (ref 74–99)
Potassium: 3.8 mmol/L (ref 3.5–5.5)
Protein, total: 7.3 g/dL (ref 6.4–8.2)
Sodium: 141 mmol/L (ref 136–145)

## 2020-11-18 LAB — CBC WITH AUTOMATED DIFF
ABS. BASOPHILS: 0 10*3/uL (ref 0.0–0.1)
ABS. EOSINOPHILS: 0.2 10*3/uL (ref 0.0–0.4)
ABS. IMM. GRANS.: 0 10*3/uL (ref 0.00–0.04)
ABS. LYMPHOCYTES: 4.5 10*3/uL — ABNORMAL HIGH (ref 0.9–3.6)
ABS. MONOCYTES: 0.7 10*3/uL (ref 0.05–1.2)
ABS. NEUTROPHILS: 5.4 10*3/uL (ref 1.8–8.0)
ABSOLUTE NRBC: 0 10*3/uL (ref 0.00–0.01)
BASOPHILS: 0 % (ref 0–2)
EOSINOPHILS: 2 % (ref 0–5)
HCT: 40.2 % (ref 36.0–48.0)
HGB: 14 g/dL (ref 13.0–16.0)
IMMATURE GRANULOCYTES: 0 % (ref 0.0–0.5)
LYMPHOCYTES: 41 % (ref 21–52)
MCH: 29.5 PG (ref 24.0–34.0)
MCHC: 34.8 g/dL (ref 31.0–37.0)
MCV: 84.8 FL (ref 78.0–100.0)
MONOCYTES: 7 % (ref 3–10)
MPV: 9.6 FL (ref 9.2–11.8)
NEUTROPHILS: 49 % (ref 40–73)
NRBC: 0 PER 100 WBC
PLATELET: 244 10*3/uL (ref 135–420)
RBC: 4.74 M/uL (ref 4.35–5.65)
RDW: 12.5 % (ref 11.6–14.5)
WBC: 10.9 10*3/uL (ref 4.6–13.2)
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
## 2020-11-18 NOTE — ED Notes (Signed)
Patient given d/c papers. He is alert and oriented x3, no signs of discharge noted. He understands the need to follow up with [employer].

## 2020-11-18 NOTE — ED Notes (Signed)
Asleep, resting comfortably, NAD noted

## 2020-11-18 NOTE — ED Notes (Signed)
Pt asleep but arousable, given extra blanket, refused pillow, pending dispo.

## 2020-12-10 ENCOUNTER — Inpatient Hospital Stay: Admit: 2020-12-10 | Discharge: 2020-12-10 | Payer: MEDICAID | Attending: Emergency Medicine

## 2020-12-10 DIAGNOSIS — F99 Mental disorder, not otherwise specified: Secondary | ICD-10-CM
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
## 2020-12-11 ENCOUNTER — Inpatient Hospital Stay
Admit: 2020-12-11 | Discharge: 2020-12-11 | Disposition: A | Discharge status: Home / Self Care | Payer: MEDICAID | Attending: Emergency Medicine

## 2020-12-11 DIAGNOSIS — Z0000 Encounter for general adult medical examination without abnormal findings: Secondary | ICD-10-CM
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
## 2020-12-11 NOTE — ED Notes (Signed)
Patient comes in stating that he is hearing voices. Patient states that the voices are  Not telling him to hurt himself or hurt anyone else. Patient was here yesterday but left before being seen by provider. Patient was also seen by mental health at another facility.

## 2020-12-16 ENCOUNTER — Inpatient Hospital Stay
Admit: 2020-12-16 | Discharge: 2020-12-22 | Disposition: A | Discharge status: Home / Self Care | Payer: MEDICAID | Attending: Psychiatry | Admitting: Psychiatry

## 2020-12-16 DIAGNOSIS — F209 Schizophrenia, unspecified: Secondary | ICD-10-CM
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
## 2020-12-16 NOTE — ED Notes (Signed)
Crisis counselor evaluated pt at bedside.
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
## 2020-12-16 NOTE — ED Notes (Signed)
Assumed care of ambulatory pt from triage with father.   Pt provided urine specimen.   Straight stuck pt for labs.   COVID specimen and labs collected/labeled and walked to STAT lab for processing.     Pt not forth-coming with information to this RN.   Denies SI/HI/AH/VH.   Reports he lives with his mother and grandmother.   Denies hurting anyone recently.   Father encouraging pt to provide the truth, but pt continues to deny.     Pt and father updated with plan of care.   Pending crisis evaluation.     Blue paper scrubs and red non-skid hospital socks provided.   Warm blankets provided.

## 2020-12-17 LAB — CBC WITH AUTO DIFFERENTIAL
Basophils %: 0 % (ref 0–2)
Basophils Absolute: 0 10*3/uL (ref 0.0–0.1)
Eosinophils %: 3 % (ref 0–5)
Eosinophils Absolute: 0.3 10*3/uL (ref 0.0–0.4)
Granulocyte Absolute Count: 0 10*3/uL (ref 0.00–0.04)
Hematocrit: 36.9 % (ref 36.0–48.0)
Hemoglobin: 12.7 g/dL — ABNORMAL LOW (ref 13.0–16.0)
Immature Granulocytes: 0 % (ref 0.0–0.5)
Lymphocytes %: 39 % (ref 21–52)
Lymphocytes Absolute: 3.8 10*3/uL — ABNORMAL HIGH (ref 0.9–3.6)
MCH: 29.2 PG (ref 24.0–34.0)
MCHC: 34.4 g/dL (ref 31.0–37.0)
MCV: 84.8 FL (ref 78.0–100.0)
MPV: 9.8 FL (ref 9.2–11.8)
Monocytes %: 9 % (ref 3–10)
Monocytes Absolute: 0.9 10*3/uL (ref 0.05–1.2)
NRBC Absolute: 0 10*3/uL (ref 0.00–0.01)
Neutrophils %: 48 % (ref 40–73)
Neutrophils Absolute: 4.7 10*3/uL (ref 1.8–8.0)
Nucleated RBCs: 0 PER 100 WBC
Platelets: 234 10*3/uL (ref 135–420)
RBC: 4.35 M/uL (ref 4.35–5.65)
RDW: 12.6 % (ref 11.6–14.5)
WBC: 9.7 10*3/uL (ref 4.6–13.2)

## 2020-12-17 LAB — DRUG SCREEN, URINE
AMPHETAMINES: NEGATIVE
Amphetamine Screen, Urine: NEGATIVE
BARBITURATES: NEGATIVE
BENZODIAZEPINES: NEGATIVE
Barbiturate Screen, Urine: NEGATIVE
Benzodiazepine Screen, Urine: NEGATIVE
COCAINE: NEGATIVE
Cocaine Screen Urine: NEGATIVE
METHADONE: NEGATIVE
Methadone Screen, Urine: NEGATIVE
OPIATES: NEGATIVE
Opiate Screen, Urine: NEGATIVE
PCP Screen, Urine: NEGATIVE
PCP(PHENCYCLIDINE): NEGATIVE
THC (TH-CANNABINOL): POSITIVE — AB
THC Screen, Urine: POSITIVE — AB

## 2020-12-17 LAB — BASIC METABOLIC PANEL
Anion Gap: 3 mmol/L (ref 3.0–18)
BUN: 8 MG/DL (ref 7.0–18)
Bun/Cre Ratio: 10 — ABNORMAL LOW (ref 12–20)
CO2: 27 mmol/L (ref 21–32)
Calcium: 8.2 MG/DL — ABNORMAL LOW (ref 8.5–10.1)
Chloride: 110 mmol/L (ref 100–111)
Creatinine: 0.81 MG/DL (ref 0.6–1.3)
EGFR IF NonAfrican American: >60 mL/min/{1.73_m2} (ref >60)
GFR African American: >60 mL/min/{1.73_m2} (ref >60)
Glucose: 112 mg/dL — ABNORMAL HIGH (ref 74–99)
Potassium: 3.4 mmol/L — ABNORMAL LOW (ref 3.5–5.5)
Sodium: 140 mmol/L (ref 136–145)

## 2020-12-17 LAB — URINE MICROSCOPIC ONLY
BACTERIA, URINE: NEGATIVE /hpf
Bacteria: NEGATIVE /hpf
RBC, UA: NEGATIVE /hpf (ref 0–5)
RBC: NEGATIVE /hpf (ref 0–5)
WBC, UA: 0 /hpf (ref 0–4)
WBC: 0 /hpf (ref 0–4)

## 2020-12-17 LAB — COVID-19 WITH INFLUENZA A/B
Influenza A By PCR: NOT DETECTED
Influenza A by PCR: NOT DETECTED
Influenza B By PCR: NOT DETECTED
Influenza B by PCR: NOT DETECTED
SARS-CoV-2 by PCR: NOT DETECTED
SARS-CoV-2: NOT DETECTED

## 2020-12-17 LAB — URINALYSIS W/ RFLX MICROSCOPIC
Bilirubin, Urine: NEGATIVE
Bilirubin: NEGATIVE
Blood, Urine: NEGATIVE
Blood: NEGATIVE
Glucose, Ur: NEGATIVE mg/dL
Glucose: NEGATIVE mg/dL
Leukocyte Esterase, Urine: NEGATIVE
Leukocyte Esterase: NEGATIVE
Nitrite, Urine: NEGATIVE
Nitrites: NEGATIVE
Protein, UA: 30 mg/dL — AB
Protein: 30 mg/dL — AB
Specific Gravity, UA: 1.029 (ref 1.005–1.030)
Specific gravity: 1.029 (ref 1.005–1.030)
Urobilinogen, UA, POCT: 1 EU/dL (ref 0.2–1.0)
Urobilinogen: 1 EU/dL (ref 0.2–1.0)
pH (UA): 6 (ref 5.0–8.0)
pH, UA: 6 (ref 5.0–8.0)

## 2020-12-17 LAB — SALICYLATE
Salicylate level: <1.7 MG/DL — ABNORMAL LOW (ref 2.8–20.0)
Salicylate: <1.7 MG/DL — ABNORMAL LOW (ref 2.8–20.0)

## 2020-12-17 LAB — ETHYL ALCOHOL
ALCOHOL(ETHYL),SERUM: <3 MG/DL (ref 0–3)
Ethyl Alcohol: <3 MG/DL (ref 0–3)

## 2020-12-17 LAB — ACETAMINOPHEN LEVEL: Acetaminophen Level: <2 ug/mL — ABNORMAL LOW (ref 10.0–30.0)

## 2020-12-17 LAB — METABOLIC PANEL, BASIC
Anion gap: 3 mmol/L (ref 3.0–18)
BUN/Creatinine ratio: 10 — ABNORMAL LOW (ref 12–20)
BUN: 8 MG/DL (ref 7.0–18)
CO2: 27 mmol/L (ref 21–32)
Calcium: 8.2 MG/DL — ABNORMAL LOW (ref 8.5–10.1)
Chloride: 110 mmol/L (ref 100–111)
Creatinine: 0.81 MG/DL (ref 0.6–1.3)
GFR est AA: >60 mL/min/{1.73_m2} (ref >60)
GFR est non-AA: >60 mL/min/{1.73_m2} (ref >60)
Glucose: 112 mg/dL — ABNORMAL HIGH (ref 74–99)
Potassium: 3.4 mmol/L — ABNORMAL LOW (ref 3.5–5.5)
Sodium: 140 mmol/L (ref 136–145)

## 2020-12-17 LAB — CBC WITH AUTOMATED DIFF
ABS. BASOPHILS: 0 10*3/uL (ref 0.0–0.1)
ABS. EOSINOPHILS: 0.3 10*3/uL (ref 0.0–0.4)
ABS. IMM. GRANS.: 0 10*3/uL (ref 0.00–0.04)
ABS. LYMPHOCYTES: 3.8 10*3/uL — ABNORMAL HIGH (ref 0.9–3.6)
ABS. MONOCYTES: 0.9 10*3/uL (ref 0.05–1.2)
ABS. NEUTROPHILS: 4.7 10*3/uL (ref 1.8–8.0)
ABSOLUTE NRBC: 0 10*3/uL (ref 0.00–0.01)
BASOPHILS: 0 % (ref 0–2)
EOSINOPHILS: 3 % (ref 0–5)
HCT: 36.9 % (ref 36.0–48.0)
HGB: 12.7 g/dL — ABNORMAL LOW (ref 13.0–16.0)
IMMATURE GRANULOCYTES: 0 % (ref 0.0–0.5)
LYMPHOCYTES: 39 % (ref 21–52)
MCH: 29.2 PG (ref 24.0–34.0)
MCHC: 34.4 g/dL (ref 31.0–37.0)
MCV: 84.8 FL (ref 78.0–100.0)
MONOCYTES: 9 % (ref 3–10)
MPV: 9.8 FL (ref 9.2–11.8)
NEUTROPHILS: 48 % (ref 40–73)
NRBC: 0 PER 100 WBC
PLATELET: 234 10*3/uL (ref 135–420)
RBC: 4.35 M/uL (ref 4.35–5.65)
RDW: 12.6 % (ref 11.6–14.5)
WBC: 9.7 10*3/uL (ref 4.6–13.2)

## 2020-12-17 LAB — ACETAMINOPHEN: Acetaminophen level: <2 ug/mL — ABNORMAL LOW (ref 10.0–30.0)

## 2020-12-17 NOTE — ED Notes (Signed)
Patient awake, alert and mother at bedside. Patient ambulatory to bathroom and returned to room

## 2020-12-17 NOTE — ED Notes (Signed)
Patient laying on stretcher, mother at bedside. No signs of distress noted at this time.

## 2020-12-17 NOTE — ED Notes (Signed)
Pt resting on stretcher in position of comfort.   Eyes closed.   Equal chest rise/fall noted.     Will continue to monitor pt

## 2020-12-17 NOTE — ED Notes (Signed)
Pt resting on stretcher with eyes closed.   Appears to be sleeping comfortably.   Equal chest rise/fall noted.     Will continue to monitor at this time.

## 2020-12-17 NOTE — ED Notes (Signed)
Patient asleep, mother at bedside

## 2020-12-17 NOTE — ED Notes (Signed)
Pt's father questioning multiple staff members when pt is going to get a bed.  States he was told by the Crisis Counselor that there was a bed available and pt should be transported to bed shortly.   Called crisis counselor and spoke with [employer].   Per Sterling Big, father was told that she was going to work on getting pt a bed.   Unable to get bed overnight as pt's case has to be presented to Psychiatrist by a Crisis nurse.  No Crisis nurse overnight.     Pt's father updated.  Informed father that he could go home, but he is concerned that pt might attempt to leave if he is not present.     Recliner and warm blankets provided to pt's father.

## 2020-12-18 NOTE — ED Notes (Signed)
Patient resting comfortably on the stretcher with sitter outside the door.  Patient has no signs or symptoms of acute distress at this time.  Patient has no concerns or questions about his treatment plan.

## 2020-12-18 NOTE — ED Notes (Signed)
Patient eating breakfast, no complaints at this time

## 2020-12-18 NOTE — ED Notes (Signed)
Assume care of patient, patient still states that he is SI and HI, father at bedside, resting at this time, POC discussed with patient and father, still waiting for bed

## 2020-12-18 NOTE — ED Notes (Signed)
Patient asleep, no acute distress observed

## 2020-12-18 NOTE — ED Notes (Signed)
Patient alert, laying quietly on stretcher, has no request at this time

## 2020-12-18 NOTE — ED Notes (Signed)
7:22 AM :Pt care assumed from Dr. Griffin Basil , ED provider. Pt complaint(s), current treatment plan, progression and available diagnostic results have been discussed thoroughly.  Intended Disposition: Admit  Pending diagnostic reports and/or labs (please list): Crisis placement    Patient was signed out me follow-up placement plan.  The patient has been follow-up with crisis and family would like the patient to go to [place] or to [facility] and bed search is underway.  We will continue to monitor the patient until a disposition is made.Myriam Jacobson, DO 7:23 AM

## 2020-12-18 NOTE — ED Notes (Signed)
Patient lying in bed resting.

## 2020-12-19 NOTE — ED Notes (Signed)
Report called Harry Nelson on the behavioral health unit

## 2020-12-19 NOTE — ED Notes (Signed)
 Purposeful rounding completed:    Side rails up x 1:  YES  Bed in low position and wheels locked: YES  Call bell within reach: NO, sitter at bedside  Comfort addressed: YES    Toileting needs addressed: YES  Plan of care reviewed/updated with patient and or family members: YES  IV site assessed: N/A  Pain assessed and addressed: YES, 0  Patient resting watching TV, patient has been calm all shift

## 2020-12-19 NOTE — ED Notes (Signed)
Pt laying with eyes closed, respirations even and unlabored

## 2020-12-19 NOTE — ED Notes (Signed)
Patient laying with eyes closed,  Respirations even and unlabored

## 2020-12-19 NOTE — ED Notes (Signed)
Purposeful rounding completed:    Side rails up x 1:  YES  Bed in low position and wheels locked: YES  Call bell within reach: NO, sitter at bedside  Comfort addressed: YES    Toileting needs addressed: YES  Plan of care reviewed/updated with patient and or family members: YES  IV site assessed: N/A  Pain assessed and addressed: YES, 0  Patient resting at this time

## 2020-12-19 NOTE — Behavioral Health Treatment Team (Signed)
Patient given Cogentin for EPS and Haldol for psychosis.

## 2020-12-19 NOTE — Behavioral Health Treatment Team (Signed)
Patient admitted to [facility] unit for suicidal thoughts, aggressive outburst, psychosis on a voluntary status.  Patient is currently alert and oriented x 4 and he is not able to recall events that led to admission. Pt denied feeling SI/HI or AVH at time of assessment.  He is unaware of what triggers him to want to become violent towards others.  He appeared anxious by shuffling from one foot to the next.  Had low tone of voice, flat affect and poor eye contact.  Slow to respond/thought blocking or poverty of thought observed.  Denied illicit drug use, but was urine drug screen positive for THC. Patient is cooperative, but very guarded or limited.       Patient was oriented to unit and rules. Patient was checked for contraband.  Patient placed on suicide precautions where rounds will be monitored every 15 minutes for safety.      Opportunity for questions provided.  All needs assessed.    RN will initiate, develop, implement, review or revise treatment plan.

## 2020-12-19 NOTE — ED Notes (Signed)
Purposeful rounding completed:    Side rails up x 1:  YES  Bed in low position and wheels locked: YES  Call bell within reach: NO, sitter at bedside  Comfort addressed: YES    Toileting needs addressed: YES  Plan of care reviewed/updated with patient and or family members: YES  IV site assessed: N/A  Pain assessed and addressed: YES, 0

## 2020-12-19 NOTE — ED Notes (Signed)
Winter from Crisis in to see patient, requesting to get patients weight, patient calm, ambulated to scale, current weight placed in chart, patient returned to room, sitter at bdside

## 2020-12-19 NOTE — ED Notes (Signed)
Patient calm, watching TV  Purposeful rounding completed:    Side rails up x 1:  YES  Bed in low position and wheels locked: YES  Call bell within reach: NO, sitter at bedside  Comfort addressed: YES    Toileting needs addressed: YES  Plan of care reviewed/updated with patient and or family members: YES  IV site assessed: N/A  Pain assessed and addressed: YES, 0

## 2020-12-20 DIAGNOSIS — F209 Schizophrenia, unspecified: Principal | ICD-10-CM

## 2020-12-20 MED ORDER — PERPHENAZINE 2 MG TAB
2 mg | Freq: Two times a day (BID) | ORAL | Status: DC
Start: 2020-12-20 — End: 2020-12-21
  Administered 2020-12-21 (×2): via ORAL

## 2020-12-20 MED ORDER — HALOPERIDOL 5 MG TAB
5 mg | Freq: Four times a day (QID) | ORAL | Status: DC | PRN
Start: 2020-12-20 — End: 2020-12-22
  Administered 2020-12-20: 01:00:00 via ORAL

## 2020-12-20 MED ORDER — BENZTROPINE 1 MG/ML IJ SOLN
1 mg/mL | Freq: Four times a day (QID) | INTRAMUSCULAR | Status: DC | PRN
Start: 2020-12-20 — End: 2020-12-22

## 2020-12-20 MED ORDER — HYDROXYZINE PAMOATE 50 MG CAP
50 mg | Freq: Three times a day (TID) | ORAL | Status: DC | PRN
Start: 2020-12-20 — End: 2020-12-22
  Administered 2020-12-22: 16:00:00 via ORAL

## 2020-12-20 MED ORDER — LORAZEPAM 2 MG/ML SYRINGE
2 mg/mL | Freq: Four times a day (QID) | INTRAMUSCULAR | Status: DC | PRN
Start: 2020-12-20 — End: 2020-12-22
  Administered 2020-12-22: 17:00:00 via INTRAMUSCULAR

## 2020-12-20 MED ORDER — BENZTROPINE 1 MG TAB
1 mg | Freq: Four times a day (QID) | ORAL | Status: DC | PRN
Start: 2020-12-20 — End: 2020-12-22
  Administered 2020-12-20 – 2020-12-22 (×2): via ORAL

## 2020-12-20 MED ORDER — HALOPERIDOL LACTATE 5 MG/ML IJ SOLN
5 mg/mL | Freq: Four times a day (QID) | INTRAMUSCULAR | Status: DC | PRN
Start: 2020-12-20 — End: 2020-12-22
  Administered 2020-12-22: 17:00:00 via INTRAMUSCULAR

## 2020-12-20 MED ORDER — TRAZODONE 50 MG TAB
50 mg | Freq: Every evening | ORAL | Status: DC | PRN
Start: 2020-12-20 — End: 2020-12-22

## 2020-12-20 MED FILL — BENZTROPINE 1 MG TAB: 1 mg | ORAL | Qty: 1

## 2020-12-20 MED FILL — HALOPERIDOL 5 MG TAB: 5 mg | ORAL | Qty: 1

## 2020-12-20 MED FILL — LORAZEPAM 2 MG/ML IJ SOLN: 2 mg/mL | INTRAMUSCULAR | Qty: 1

## 2020-12-20 NOTE — Progress Notes (Signed)
Problem: Falls - Risk of  Goal: *Absence of Falls  Description: Document Harry Nelson Fall Risk and appropriate interventions in the flowsheet.    AEB remaining free of falls and wearing skid proof socks daily while hospitalized.   Outcome: Progressing Towards Goal,  Pt has not had any falls thus far this shift.  Note: Fall Risk Interventions:           Problem: Aggression and Hostility (Behavioral Health)  Goal: *Identify early warning signs of explosive outbursts or catastrophic reaction  Description: AEB identifying at least 2 early warning signs of explosive outburst or catastrophic reactions prior to discharge from hospital setting.  Outcome: Progressing Towards Goal, Pt did show some hostility when told he was not on methadone. Pt was able to be redirected to rooml  Goal: *Avoid situations that produce feelings of frustration, embarrassment, anxiety, or impatience  Description: AEB avoiding situations that produce feelings of frustration, embarrassment, anxiety or impatience daily while hospitalized.  Outcome: Progressing Towards Goal  Goal: *Aggression and Hostility Interventions  Outcome: Progressing Towards Goal     Problem: Psychosis  Goal: *STG: Decreased hallucinations  Description: AEB verbalizing a marked decrease in hallucinations prior to discharge from hospital setting.  Outcome: Progressing Towards Goal  Goal: *STG: Remains safe in hospital  Description: AEB remaining safe and free from harm daily while hospitalized.   Outcome: Progressing Towards Goal  Goal: *STG/LTG: Complies with medication therapy  Description: AEB consuming all medication as prescribed daily while hospitalized.   Outcome: Progressing Towards Goal  Goal: Interventions  Outcome: Progressing Towards Goal     Problem: Suicide  Goal: *STG:  Verbalizes alternative ways of dealing with maladaptive feelings/behaviors  Description: AEB verbalizing at least 3 alternative ways of dealing with maladaptive feelings/behaviors prior to discharge  from hospital setting.  Outcome: Progressing Towards Goal  Goal: *STG/LTG: No longer expresses self destructive or suicidal thoughts  Description: AEB verbalizing that he is no longer experiencing self destructive/suicidal thoughts prior to discharge from hospital setting.   Outcome: Progressing Towards Goal  Goal: Interventions  Outcome: Progressing Towards Goal    Pt has been calm and cooperative this shift. Pt denies SI and denies wanting to hurt self or others. Pt has been med compliant. Pt has been in day room watching tv. Pt stable.

## 2020-12-20 NOTE — Behavioral Health Treatment Team (Signed)
Pt slept about 7 hours.

## 2020-12-20 NOTE — H&P (Signed)
Psychiatry History and Physical    Subjective:     Date of Evaluation:  12/20/2020    Reason for Referral:  Harry Nelson was referred to the examiners from ED for psychosis.    History of Presenting Problem: 25 yo AA male in NAD, well developed and nourished with hx of Schizophrenia who presented to the ED for psychosis. He endorses SI, depression, and AH. He denies HI, anxiety, and VH. He has no physical concerns today.     Patient Active Problem List    Diagnosis Date Noted   ??? Chronic undifferentiated schizophrenia with acute exacerbations (HCC) 12/20/2020   ??? Psychosis (HCC) 12/19/2020   ??? Cannabis-induced psychotic disorder with delusions (HCC) 08/19/2016     No past medical history on file.  No past surgical history on file.    Family History   Problem Relation Age of Onset   ??? No Known Problems Mother    ??? No Known Problems Father    ??? No Known Problems Sister    ??? No Known Problems Brother    ??? No Known Problems Maternal Aunt    ??? No Known Problems Sister       Social History     Tobacco Use   ??? Smoking status: Never Smoker   ??? Smokeless tobacco: Never Used   Substance Use Topics   ??? Alcohol use: No     Prior to Admission medications    Not on File     No Known Allergies     Review of Systems - History obtained from chart review and the patient  General ROS: negative  Psychological ROS: positive for - depression, hallucinations and suicidal ideation  Ophthalmic ROS: negative  ENT ROS: negative  Allergy and Immunology ROS: negative  Hematological and Lymphatic ROS: negative  Endocrine ROS: negative  Respiratory ROS: no cough, shortness of breath, or wheezing  Cardiovascular ROS: no chest pain or dyspnea on exertion  Gastrointestinal ROS: no abdominal pain, change in bowel habits, or black or bloody stools  Genito-Urinary ROS: no dysuria, trouble voiding, or hematuria  Musculoskeletal ROS: negative  Neurological ROS: no TIA or stroke symptoms  Dermatological ROS: negative      Objective:     Patient Vitals for  the past 8 hrs:   BP Temp Pulse Resp   12/20/20 0837 (!) 136/97 97.3 ??F (36.3 ??C) 91 20       Mental Status exam: WNL except for    Sensorium  Alert and Oriented x 2   Orientation person and place   Relations cooperative   Eye Contact poor   Appearance:  overweight   Motor Behavior:  within normal limits   Speech:  normal pitch and non-pressured   Vocabulary average   Thought Process: blocked   Thought Content hallucinations   Suicidal ideations intention   Homicidal ideations no plan  and no intention   Mood:  depressed   Affect:  mood-congruent   Memory recent  adequate   Memory remote:  adequate   Concentration:  adequate   Abstraction:  concrete   Insight:  fair   Reliability fair   Judgment:  fair         Physical Exam:   Visit Vitals  BP (!) 136/97   Pulse 91   Temp 97.3 ??F (36.3 ??C)   Resp 20   Ht 5\' 7"  (1.702 m)   Wt (!) 161.9 kg (357 lb)   SpO2 100%   BMI 55.91 kg/m??  General:  Alert, cooperative, no distress, appears stated age.   Head:  Normocephalic, without obvious abnormality, atraumatic.   Eyes:  Conjunctivae/corneas clear. PERRL, EOMs intact. Fundi benign   Ears:  Normal TMs and external ear canals both ears.   Nose: Nares normal. Septum midline. Mucosa normal. No drainage or sinus tenderness.   Throat: Lips, mucosa, and tongue normal. Teeth and gums normal.   Neck: Supple, symmetrical, trachea midline, no adenopathy, thyroid: no enlargement/tenderness/nodules, no carotid bruit and no JVD.   Back:   Symmetric, no curvature. ROM normal. No CVA tenderness.   Lungs:   Clear to auscultation bilaterally.   Chest wall:  No tenderness or deformity.   Heart:  Regular rate and rhythm, S1, S2 normal, no murmur, click, rub or gallop.   Abdomen:   Soft, non-tender. Bowel sounds normal. No masses,  No organomegaly.           Extremities: Extremities normal, atraumatic, no cyanosis or edema.   Pulses: 2+ and symmetric all extremities.   Skin: Skin color, texture, turgor normal. No rashes or lesions   Lymph  nodes: Cervical, supraclavicular, and axillary nodes normal.   Neurologic: CNII-XII intact. Normal strength, sensation and reflexes throughout.           Impression:      Principal Problem:    Chronic undifferentiated schizophrenia with acute exacerbations (HCC) (12/20/2020)    Active Problems:    Psychosis (HCC) (12/19/2020)          Plan:     Recommendations for Treatment/Conditions:  Psychiatric treatment recommended while in hospital  Admit to behavioral health for Schizophrenia.     Referral To:   Inpatient psychiatric care      [place], [place]   12/20/2020 1:25 PM

## 2020-12-21 LAB — HEPATIC FUNCTION PANEL
A-G Ratio: 0.9 (ref 0.8–1.7)
ALT (SGPT): 38 U/L (ref 16–61)
ALT: 38 U/L (ref 16–61)
AST (SGOT): 29 U/L (ref 10–38)
AST: 29 U/L (ref 10–38)
Albumin/Globulin Ratio: 0.9 (ref 0.8–1.7)
Albumin: 3.3 g/dL — ABNORMAL LOW (ref 3.4–5.0)
Albumin: 3.3 g/dL — ABNORMAL LOW (ref 3.4–5.0)
Alk. phosphatase: 95 U/L (ref 45–117)
Alkaline Phosphatase: 95 U/L (ref 45–117)
Bilirubin, Direct: 0.1 MG/DL (ref 0.0–0.2)
Bilirubin, direct: 0.1 MG/DL (ref 0.0–0.2)
Bilirubin, total: 0.4 MG/DL (ref 0.2–1.0)
Globulin: 3.6 g/dL (ref 2.0–4.0)
Globulin: 3.6 g/dL (ref 2.0–4.0)
Protein, total: 6.9 g/dL (ref 6.4–8.2)
Total Bilirubin: 0.4 MG/DL (ref 0.2–1.0)
Total Protein: 6.9 g/dL (ref 6.4–8.2)

## 2020-12-21 LAB — LIPID PANEL
CHOL/HDL Ratio: 3.1 (ref 0–5.0)
Chol/HDL Ratio: 3.1 (ref 0–5.0)
Cholesterol, Total: 146 MG/DL (ref <200)
Cholesterol, total: 146 MG/DL (ref <200)
HDL Cholesterol: 47 MG/DL (ref 40–60)
HDL: 47 MG/DL (ref 40–60)
LDL Calculated: 82.8 MG/DL (ref 0–100)
LDL, calculated: 82.8 MG/DL (ref 0–100)
Triglyceride: 81 MG/DL (ref <150)
Triglycerides: 81 MG/DL (ref <150)
VLDL Cholesterol Calculated: 16.2 MG/DL
VLDL, calculated: 16.2 MG/DL

## 2020-12-21 LAB — TSH 3RD GENERATION
TSH: 1.97 u[IU]/mL (ref 0.36–3.74)
TSH: 1.97 u[IU]/mL (ref 0.36–3.74)

## 2020-12-21 LAB — HEMOGLOBIN A1C W/O EAG
Hemoglobin A1C: 5.4 % (ref 4.2–5.6)
Hemoglobin A1c: 5.4 % (ref 4.2–5.6)

## 2020-12-21 MED ORDER — HALOPERIDOL 5 MG TAB
5 mg | Freq: Two times a day (BID) | ORAL | Status: DC
Start: 2020-12-21 — End: 2020-12-22
  Administered 2020-12-21 – 2020-12-22 (×3): via ORAL

## 2020-12-21 MED FILL — HALOPERIDOL 5 MG TAB: 5 mg | ORAL | Qty: 1

## 2020-12-21 MED FILL — PERPHENAZINE 2 MG TAB: 2 mg | ORAL | Qty: 1

## 2020-12-21 NOTE — Progress Notes (Signed)
Problem: Falls - Risk of  Goal: *Absence of Falls  Description: Document Harry Nelson Fall Risk and appropriate interventions in the flowsheet.    AEB remaining free of falls and wearing skid proof socks daily while hospitalized.   Outcome: Progressing Towards Goal  Note: Fall Risk Interventions:  Non-skid socks       Problem: Psychosis  Goal: *STG: Remains safe in hospital  Description: AEB remaining safe and free from harm daily while hospitalized.   Outcome: Progressing Towards Goal     Problem: Suicide  Goal: *STG/LTG: No longer expresses self destructive or suicidal thoughts  Description: AEB verbalizing that he is no longer experiencing self destructive/suicidal thoughts prior to discharge from hospital setting.   Outcome: Progressing Towards Goal     Pt came to eat in milieu. Pt speaks quickly and softly. Pt denies SIHI and pain. Pt has jerky motor movement. Pt encouraged to let staff know if/when he needs assistance. Pt verbalizes understanding.

## 2020-12-21 NOTE — Progress Notes (Signed)
Pt resting calmly in room with even, unlabored respirations. Pt interactions with staff during this shift and previous have been calm and cooperative.

## 2020-12-21 NOTE — Behavioral Health Treatment Team (Signed)
Patient signed Verbal Release of Information for parents, Marney Doctor and Guido Tennenbaum to be able to speak with staff, Doctor and Social Worker while admitted to this facility.    Harry Nelson  [phone]

## 2020-12-21 NOTE — Behavioral Health Treatment Team (Deleted)
Patient appeared to have slept 7 hours.
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
## 2020-12-22 MED ORDER — ACETAMINOPHEN 325 MG TABLET
325 mg | Freq: Four times a day (QID) | ORAL | Status: DC | PRN
Start: 2020-12-22 — End: 2020-12-22
  Administered 2020-12-22: 15:00:00 via ORAL

## 2020-12-22 MED ORDER — HALOPERIDOL 5 MG TAB
5 mg | Freq: Every day | ORAL | Status: DC
Start: 2020-12-22 — End: 2020-12-22

## 2020-12-22 MED ORDER — HALOPERIDOL 10 MG TAB
10 mg | Freq: Every evening | ORAL | Status: DC
Start: 2020-12-22 — End: 2020-12-22

## 2020-12-22 MED FILL — LORAZEPAM 2 MG/ML SYRINGE: 2 mg/mL | INTRAMUSCULAR | Qty: 1

## 2020-12-22 MED FILL — HALOPERIDOL LACTATE 5 MG/ML IJ SOLN: 5 mg/mL | INTRAMUSCULAR | Qty: 1

## 2020-12-22 MED FILL — HALOPERIDOL 5 MG TAB: 5 mg | ORAL | Qty: 1

## 2020-12-22 MED FILL — MAPAP (ACETAMINOPHEN) 325 MG TABLET: 325 mg | ORAL | Qty: 2

## 2020-12-22 MED FILL — HYDROXYZINE PAMOATE 50 MG CAP: 50 mg | ORAL | Qty: 1

## 2020-12-22 MED FILL — BENZTROPINE 1 MG TAB: 1 mg | ORAL | Qty: 1

## 2020-12-22 NOTE — Behavioral Health Treatment Team (Signed)
Doctor informed patient still requesting to leave and asked for patient to be evaluated by [facility].  Paperwork filled out by this nurse, notarized and given to receptionist. Crisis worker informed paperwork at front desk.

## 2020-12-22 NOTE — Behavioral Health Treatment Team (Unsigned)
Patient appeared to sleep 7 plus hours.

## 2020-12-22 NOTE — Progress Notes (Signed)
Problem: Falls - Risk of  Goal: *Absence of Falls  Description: Document Bridgette Habermann Fall Risk and appropriate interventions in the flowsheet.    AEB remaining free of falls and wearing skid proof socks daily while hospitalized.   Outcome: Progressing Towards Goal  Note: Fall Risk Interventions:     Problem: Aggression and Hostility (Behavioral Health)  Goal: *Identify early warning signs of explosive outbursts or catastrophic reaction  Description: AEB identifying at least 2 early warning signs of explosive outburst or catastrophic reactions prior to discharge from hospital setting.  Outcome: Progressing Towards Goal  Goal: *Avoid situations that produce feelings of frustration, embarrassment, anxiety, or impatience  Description: AEB avoiding situations that produce feelings of frustration, embarrassment, anxiety or impatience daily while hospitalized.  Outcome: Progressing Towards Goal     Problem: Psychosis  Goal: *STG: Decreased hallucinations  Description: AEB verbalizing a marked decrease in hallucinations prior to discharge from hospital setting.  Outcome: Progressing Towards Goal  Goal: *STG: Remains safe in hospital  Description: AEB remaining safe and free from harm daily while hospitalized.   Outcome: Progressing Towards Goal  Goal: *STG/LTG: Complies with medication therapy  Description: AEB consuming all medication as prescribed daily while hospitalized.   Outcome: Progressing Towards Goal     Pt sitting in milieu eating breakfast. Pt denies pain, SIHI AVH. Pt speaks very softly. Pt encouraged to let staff know if he needs assistance; pt verbalizes understanding. Pt calm and cooperative. Pt alert and oriented x4.

## 2020-12-22 NOTE — Progress Notes (Signed)
[facility]  Inpatient Progress Note     Date of Service: 12/22/20  Hospital Day: 3     Subjective/Interval History   12/22/20    Treatment Team Notes:  Notes reviewed and/or discussed and report that Harry Nelson is a patient with a history of a thought disorder, attention invited to the dictated admission note which is self-explanatory.      Patient interview: Harry Nelson was interviewed by this Clinical research associate today.  During the session today the patient was more responsive during the past.  He was less distractible, with describing the voices as being less intense and less frequent.  So the issue of a switch to a long-acting antipsychotic and specifically prescriptions for Haldol dec brought up during our discussion.  The patient indicated that he prefers to continue to take his medications by mouth, since "needles hurt."  We will continue to suggest to switch hoping that the patient will agree to its change      Objective     Visit Vitals  BP 132/82 (BP 1 Location: Left upper arm, BP Patient Position: At rest)   Pulse 92   Temp 97.5 ??F (36.4 ??C)   Resp 18   Ht 5\' 7"  (1.702 m)   Wt (!) 161.9 kg (357 lb)   SpO2 100%   BMI 55.91 kg/m??     Vitals are stable    No results found for this or any previous visit (from the past 24 hour(s)).    Mental Status Examination     Appearance/Hygiene 25 y.o. BLACK/AFR[place] AMERICAN male  Hygiene: Improving slowly   Behavior/Social Relatedness Appropriate   Musculoskeletal Gait/Station: appropriate  Tone (flaccid, cogwheeling, spastic): not assessed  Psychomotor (hyperkinetic, hypokinetic): calm   Involuntary movements (tics, dyskinesias, akathisa, stereotypies): none   Speech   Rate, rhythm, volume, fluency and articulation are appropriate   Mood   denies depression   Affect    irritability is improving   Thought Process Linear and goal directed   Thought Content and Perceptual Disturbances Denies self-injurious behavior (SIB), suicidal ideation (SI), aggressive  behavior or homicidal ideation (HI), however history of control loss described upon admission  Auditory hallucinations are less intense.   Sensorium and Cognition  Grossly intact   Insight  Limited   Judgment  Limited        Assessment/Plan      Psychiatric Diagnoses:   Patient Active Problem List   Diagnosis Code   ??? Cannabis-induced psychotic disorder with delusions ([facility]) F12.950   ??? Psychosis ([facility]) F29   ??? 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  Haloperidol will be increased to 5 mg in the morning and 10 mg at bedtime.  Hopefully as he continues to improve, the patient will be able to agree to a switch to Haldol DEC.  2.  Reviewed instructions, risks, benefits and side effects of medications  3.  Disposition/Discharge Date: self-care/home, TBD    June 25, MD, [facility]  [facility]  Psychiatry

## 2020-12-22 NOTE — Behavioral Health Treatment Team (Signed)
Patient currently being evaluated by CSB in conference room.       1610 UPDATE:  CSB worker voiced he is recommending TDO and will be sending paperwork to magistrate.
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
## 2020-12-22 NOTE — Behavioral Health Treatment Team (Signed)
 Patient noted by this nurse to be sitting on desk in cubbie area next to linen closet. Patient voiced I don't feel right, like I need to go outside... like RIGHT NOW.  Upon further questioning by this nurse, patient stated I feel kinda sick, like I really need fresh air.  Patient was offered and accepted PRN Cogentin 0.5mg  cap for possible side effects of scheduled Haldol given this morning.  Patient also received PRN Vistaril 50mg  cap PO for anxiety.  Will continue to monitor and provide interventions as appropriate.

## 2020-12-22 NOTE — Behavioral Health Treatment Team (Signed)
CSB called facility to inform magistrate did not find that patient meets criteria for TDO.  On call doctor paged, admitting doctor gave this nurse telephone order to discharge patient Against Medical Advice if not placed under TDO status. Patient denies thoughts of self harm or harm to others at this time. Patient has not required further PRN medications this shift. Patient has eaten meals and has been free from falls, has not demonstrated any further aggressive behaviors. Patient voices father will be picking him up upon discharge this evening.    Patient given copy of discharge paperwork with information for [facility]. Patient is alert x3 and ambulatory. Patient has all personal belongings and has signed form. Patient armband taken and shredded. Patient escorted to exit by security at time of discharge. Patient discharged to home address.

## 2020-12-22 NOTE — Behavioral Health Treatment Team (Signed)
Patient had to be stopped twice from exiting door of dayroom while staff was exiting with patient being discharged.  Patient reminded he is not being discharged today and doctor will be on unit again in the morning. Patient encouraged to speak with doctor tomorrow morning.

## 2021-02-16 ENCOUNTER — Other Ambulatory Visit: Payer: Self-pay

## 2021-02-16 ENCOUNTER — Emergency Department (HOSPITAL_COMMUNITY)
Admission: EM | Admit: 2021-02-16 | Discharge: 2021-02-17 | Disposition: A | Discharge status: Other Acute Inpatient Hospital | Payer: Medicaid - Out of State | Attending: Emergency Medicine | Admitting: Emergency Medicine

## 2021-02-16 DIAGNOSIS — R45850 Homicidal ideations: Secondary | ICD-10-CM | POA: Diagnosis not present

## 2021-02-16 DIAGNOSIS — F209 Schizophrenia, unspecified: Secondary | ICD-10-CM | POA: Diagnosis present

## 2021-02-16 DIAGNOSIS — R45851 Suicidal ideations: Secondary | ICD-10-CM | POA: Diagnosis not present

## 2021-02-16 DIAGNOSIS — Z20822 Contact with and (suspected) exposure to covid-19: Secondary | ICD-10-CM | POA: Diagnosis not present

## 2021-02-16 DIAGNOSIS — Y900 Blood alcohol level of less than 20 mg/100 ml: Secondary | ICD-10-CM | POA: Diagnosis not present

## 2021-02-16 DIAGNOSIS — F29 Unspecified psychosis not due to a substance or known physiological condition: Secondary | ICD-10-CM | POA: Insufficient documentation

## 2021-02-16 DIAGNOSIS — R440 Auditory hallucinations: Secondary | ICD-10-CM | POA: Diagnosis present

## 2021-02-16 LAB — CBC WITH DIFFERENTIAL/PLATELET
Abs Immature Granulocytes: 0.04 10*3/uL (ref 0.00–0.07)
Basophils Absolute: 0 10*3/uL (ref 0.0–0.1)
Basophils Relative: 0 %
Eosinophils Absolute: 0.3 10*3/uL (ref 0.0–0.5)
Eosinophils Relative: 3 %
HCT: 41.8 % (ref 39.0–52.0)
Hemoglobin: 14 g/dL (ref 13.0–17.0)
Immature Granulocytes: 1 %
Lymphocytes Relative: 34 %
Lymphs Abs: 2.7 10*3/uL (ref 0.7–4.0)
MCH: 30 pg (ref 26.0–34.0)
MCHC: 33.5 g/dL (ref 30.0–36.0)
MCV: 89.7 fL (ref 80.0–100.0)
Monocytes Absolute: 0.6 10*3/uL (ref 0.1–1.0)
Monocytes Relative: 7 %
Neutro Abs: 4.6 10*3/uL (ref 1.7–7.7)
Neutrophils Relative %: 55 %
Platelets: 243 10*3/uL (ref 150–400)
RBC: 4.66 MIL/uL (ref 4.22–5.81)
RDW: 13.3 % (ref 11.5–15.5)
WBC: 8.2 10*3/uL (ref 4.0–10.5)
nRBC: 0 % (ref 0.0–0.2)

## 2021-02-16 LAB — COMPREHENSIVE METABOLIC PANEL
ALT: 30 U/L (ref 0–44)
AST: 26 U/L (ref 15–41)
Albumin: 3.5 g/dL (ref 3.5–5.0)
Alkaline Phosphatase: 73 U/L (ref 38–126)
Anion gap: 6 (ref 5–15)
BUN: 10 mg/dL (ref 6–20)
CO2: 27 mmol/L (ref 22–32)
Calcium: 8.6 mg/dL — ABNORMAL LOW (ref 8.9–10.3)
Chloride: 109 mmol/L (ref 98–111)
Creatinine, Ser: 0.81 mg/dL (ref 0.61–1.24)
GFR, Estimated: >60 mL/min (ref >60)
Glucose, Bld: 102 mg/dL — ABNORMAL HIGH (ref 70–99)
Potassium: 3.7 mmol/L (ref 3.5–5.1)
Sodium: 142 mmol/L (ref 135–145)
Total Bilirubin: 0.5 mg/dL (ref 0.3–1.2)
Total Protein: 6.5 g/dL (ref 6.5–8.1)

## 2021-02-16 LAB — ETHANOL: Alcohol, Ethyl (B): <10 mg/dL (ref <10)

## 2021-02-16 LAB — RAPID URINE DRUG SCREEN, HOSP PERFORMED
Amphetamines: NOT DETECTED
Barbiturates: NOT DETECTED
Benzodiazepines: NOT DETECTED
Cocaine: NOT DETECTED
Opiates: NOT DETECTED
Tetrahydrocannabinol: POSITIVE — AB

## 2021-02-16 LAB — RESP PANEL BY RT-PCR (FLU A&B, COVID) ARPGX2
Influenza A by PCR: NEGATIVE
Influenza B by PCR: NEGATIVE
SARS Coronavirus 2 by RT PCR: NEGATIVE

## 2021-02-16 MED ORDER — ONDANSETRON HCL 4 MG PO TABS: 4.0000 mg | ORAL_TABLET | Freq: Three times a day (TID) | ORAL | Status: DC | PRN

## 2021-02-16 MED ORDER — METOCLOPRAMIDE HCL 10 MG PO TABS
10.0000 mg | ORAL_TABLET | Freq: Once | ORAL | Status: AC
  Administered 2021-02-16: 10 mg via ORAL
  Filled 2021-02-16: qty 1

## 2021-02-16 MED ORDER — OLANZAPINE 5 MG PO TBDP: 5.0000 mg | ORAL_TABLET | Freq: Three times a day (TID) | ORAL | Status: DC | PRN

## 2021-02-16 MED ORDER — ARIPIPRAZOLE 5 MG PO TABS: 15.0000 mg | ORAL_TABLET | Freq: Every day | ORAL | Status: DC

## 2021-02-16 MED ORDER — OLANZAPINE 10 MG PO TBDP
10.0000 mg | ORAL_TABLET | Freq: Every day | ORAL | Status: DC
  Administered 2021-02-16: 10 mg via ORAL
  Filled 2021-02-16: qty 1

## 2021-02-16 MED ORDER — ZIPRASIDONE MESYLATE 20 MG IM SOLR: 20.0000 mg | INTRAMUSCULAR | Status: DC | PRN

## 2021-02-16 MED ORDER — LORAZEPAM 1 MG PO TABS: 1.0000 mg | ORAL_TABLET | ORAL | Status: DC | PRN

## 2021-02-16 MED ORDER — ACETAMINOPHEN 325 MG PO TABS: 650.0000 mg | ORAL_TABLET | ORAL | Status: DC | PRN

## 2021-02-16 MED ORDER — TRAZODONE HCL 100 MG PO TABS: 100.0000 mg | ORAL_TABLET | Freq: Every day | ORAL | Status: DC

## 2021-02-16 NOTE — ED Notes (Signed)
Formatting of this note might be different from the original.  TTS consult completed.  Electronically signed by Latanya Presser, NT at 02/16/2021  2:06 PM EDT
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
## 2021-02-16 NOTE — ED Notes (Signed)
Formatting of this note might be different from the original.  Pt aware urine sample needed. Unable to provide one at this time.  Electronically signed by Latanya Presser, NT at 02/16/2021 10:30 AM EDT

## 2021-02-16 NOTE — Unmapped (Signed)
Formatting of this note might be different from the original.  Harry Nelson is a 25 year old male who presented to [facility] via EMS with reports of nausea and abdominal pain requesting a psychiatric evaluation. On assessment patient presents calm and cooperative; partially obtunded. No medications noted as given prior to. He was able to state he was from [place], [place] where he lives with his mother [name] [phone]). States "I really don't know" when asked reason for admission. Says "I was driving" when asked how he arrived in [place]; states he's been in the area 8 months and he's currently homeless. He is observed dozing in and out. UDS+THC; BAL <10. PDMP reviewed: no active prescriptions noted.     Plan:    -Continuous observation for further observation, stabilization,   and treatment. To be reassessed by psychiatry in the morning     -Resume psychotropic medications:     -Zyprexa 10 mg x1 dose given (discontinued)    -Aripriprazole 15 mg HS- begin 02/17/21    -Lithium Carbonate 450 mg CR tab 900 mg HS     -pending lab level    -Trazodone 100 mg HS     -Lithium level (lab)  Electronically signed by Nelly Rout, MD at 02/19/2021  8:26 AM EDT    Associated attestation - Nelly Rout, MD - 02/19/2021  8:26 AM EDT  Formatting of this note might be different from the original.  Case discussed, agree with above recommendations

## 2021-02-16 NOTE — ED Notes (Signed)
Formatting of this note might be different from the original.  Pt. Reminded urine sample needed. Urinal at bedside.   Electronically signed by Latanya Presser, NT at 02/16/2021 12:11 PM EDT
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
## 2021-02-16 NOTE — ED Triage Notes (Signed)
Formatting of this note might be different from the original.  PT arrives via EMS. Reports nausea and abdominal pain. Pt is also requesting a psychiatric evaluation.       Electronically signed by Mickle Mallory, RN at 02/16/2021 10:16 AM EDT
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
## 2021-02-16 NOTE — ED Notes (Signed)
Pt. Reminded urine sample needed. Urinal at bedside.
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
## 2021-02-16 NOTE — ED Notes (Signed)
Pt aware urine sample needed. Unable to provide one at this time. 

## 2021-02-16 NOTE — ED Triage Notes (Addendum)
PT arrives via EMS. Reports nausea and abdominal pain. Pt is also requesting a psychiatric evaluation.
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
## 2021-02-16 NOTE — ED Notes (Signed)
TTS consult completed 

## 2021-02-17 DIAGNOSIS — R440 Auditory hallucinations: Secondary | ICD-10-CM | POA: Insufficient documentation

## 2021-02-17 DIAGNOSIS — F209 Schizophrenia, unspecified: Secondary | ICD-10-CM | POA: Diagnosis not present

## 2021-02-17 DIAGNOSIS — F259 Schizoaffective disorder, unspecified: Secondary | ICD-10-CM | POA: Insufficient documentation
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
## 2021-02-17 NOTE — Progress Notes (Signed)
Formatting of this note might be different from the original.  Per Shonte, pt has been accepted to [facility] 3-E unit pending completion of IVC and negative COVID test faxed; fax number [phone]. Accepting provider is Dr. Betti Cruz. Patient can arrive after report is communicated to [facility] staff. Number for report is [phone].    Crissie Reese, MSW, LCSW-A  Phone: [phone]  Disposition/TOC      Electronically signed by Ulyess Blossom, LCSWA at 02/17/2021 11:23 AM EDT

## 2021-02-17 NOTE — ED Notes (Signed)
Formatting of this note might be different from the original.  Patient slept majority of the shift.  Interaction with staff was minimal.   Electronically signed by Lynnda Child, RN at 02/17/2021  6:19 AM EDT

## 2021-02-17 NOTE — Progress Notes (Signed)
Formatting of this note is different from the original.  Per Leroy Sea, patient meets criteria for inpatient treatment. There are no available or appropriate beds at [facility] today. CSW faxed referrals to the following facilities for review:    [facility]  Pending - No Request Sent N/A [address] [place] [address] [phone] --   [facility]  Pending - No Request Sent N/A [address] [place] [address] [phone] --   [facility]  Pending - No Request Sent N/A [address] [place] [address] [phone] --   [facility]  Pending - No Request Sent N/A [address] [place] [address] [phone] --   [facility]  Pending - No Request Sent N/A [address] [place], [place] [address] [phone] --   [facility]  Pending - No Request Sent N/A [address] [place] [address] [phone] --   [facility]  Pending - No Request Sent N/A [address] [place] [address] [phone] --   [facility]  Pending - No Request Sent N/A [address] [place] [address] [phone] --   [facility]  Pending - No Request Sent N/A [address], [place] [address] [phone] --   [facility]  Pending - No Request Sent N/A [address] [place] [address] [phone] --   [facility]  Pending - No Request Sent N/A [address] [place] [address] [place] [address] [phone] --   [facility]  Pending - No Request Sent N/A [address] [place] [address] [phone] --   [facility]  Pending - No Request Sent N/A [address], [place] [address] [phone] --   [facility]  Pending - No Request Sent N/A [address] [place] [address] [phone] --   [facility]  Pending - No Request Sent N/A [address] [place] [address] [phone] --   [facility]  Pending - No Request Sent N/A [address] [place], [place] [address] [phone] --   [facility]  Pending - No Request Sent N/A [address] [place] [address] [phone] --   [facility]  Pending - No Request Sent N/A [address], [place]  [address] [phone] --     TTS will continue to seek bed placement.    Crissie Reese, MSW, LCSW-A, LCAS-A  Phone: [phone]  Disposition/TOC    Electronically signed by Ulyess Blossom, LCSWA at 02/17/2021 10:19 AM EDT

## 2021-02-17 NOTE — Progress Notes (Signed)
Formatting of this note might be different from the original.  [facility] contacted CSW in reference to referral. Shonte advised that this patient will be reviewed by their provider for possible placement.    Crissie Reese, MSW, LCSW-A, LCAS-A  Phone: [phone]  Disposition/TOC    Electronically signed by Ulyess Blossom, LCSWA at 02/17/2021 10:54 AM EDT

## 2021-02-17 NOTE — ED Notes (Signed)
Patient slept majority of the shift.  Interaction with staff was minimal.
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
## 2021-02-17 NOTE — Progress Notes (Signed)
Per Leanora Cover, pt has been accepted to [facility] 3-E unit pending completion of IVC and negative COVID test faxed; fax number [phone]. Accepting provider is Dr. Betti Cruz. Patient can arrive after report is communicated to [facility] staff. Number for report is [phone].  Crissie Reese, MSW, LCSW-A Phone: [phone] Disposition/TOC

## 2021-02-17 NOTE — Progress Notes (Signed)
[facility] contacted CSW in reference to referral. Shonte advised that this patient will be reviewed by their provider for possible placement.  Crissie Reese, MSW, LCSW-A, LCAS-A Phone: [phone] Disposition/TOC

## 2021-02-17 NOTE — Progress Notes (Signed)
Per Leroy Sea, patient meets criteria for inpatient treatment. There are no available or appropriate beds at [facility] today. CSW faxed referrals to the following facilities for review:  [facility]  Pending - No Request Sent N/A [address] [place] [place] [address] [phone] --  [facility]  Pending - No Request Sent N/A [address] [place] [address] [phone] --  [facility]  Pending - No Request Sent N/A [address] [place] [address] [phone] --  [facility]  Pending - No Request Sent N/A [address] [place] [address] [phone] --  [facility]  Pending - No Request Sent N/A [address] [place], [place] [address] [phone] --  [facility]  Pending - No Request Sent N/A [address] [place] [address] [phone] --  [facility]  Pending - No Request Sent N/A [address], [place] [address] [phone] --  [facility]  Pending - No Request Sent N/A [address] [place] [address] [phone] --  [facility]  Pending - No Request Sent N/A [address] [place] [address] [phone] --  [facility]  Pending - No Request Sent N/A [address] [place] [address] [phone] --  [facility]  Pending - No Request Sent N/A [address] [place] [address] [phone] --  [facility]  Pending - No Request Sent N/A [address] [place] [address] [phone] --  [facility]  Pending - No Request Sent N/A [address] [place] [address] [phone]  --  [facility]  Pending - No Request Sent N/A [address] [place] [address] [phone] --  [facility]  Pending - No Request Sent N/A [address] [place] [address] [phone] --  [facility]  Pending - No Request Sent N/A [address] [place], [place] [address] [phone] --  [facility]  Pending - No Request Sent N/A [address], [place] [address] [phone] --  [facility]  Pending - No Request Sent N/A [address], [place] [address] [phone] --   TTS will continue to seek bed placement.  Crissie Reese, MSW, LCSW-A, LCAS-A Phone: [phone] Disposition/TOC
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
## 2022-03-20 NOTE — ED Notes (Signed)
Formatting of this note might be different from the original.  Pain assessment on discharge was tolerable.  Condition stable.  Patient discharged to home.  Patient education was completed:  yes  Education taught to:  patient  Teaching method used was discussion and handout.  Understanding of teaching was good.  Patient was discharged ambulatory.  Discharged with self.  Valuables were given to: patient.   Electronically signed by Jacquelin Hawking, RN at 03/20/2022  3:42 AM EDT

## 2022-04-28 ENCOUNTER — Inpatient Hospital Stay: Admit: 2022-04-28 | Discharge: 2022-04-28 | Disposition: A | Discharge status: Home / Self Care | Payer: MEDICAID

## 2022-04-28 DIAGNOSIS — R440 Auditory hallucinations: Secondary | ICD-10-CM

## 2022-04-28 LAB — CBC WITH AUTO DIFFERENTIAL
Absolute Immature Granulocyte: 0 10*3/uL (ref 0.00–0.04)
Basophils %: 1 % (ref 0–2)
Basophils Absolute: 0 10*3/uL (ref 0.0–0.1)
Eosinophils %: 4 % (ref 0–5)
Eosinophils Absolute: 0.2 10*3/uL (ref 0.0–0.4)
Hematocrit: 41.6 % (ref 36.0–48.0)
Hemoglobin: 13.9 g/dL (ref 13.0–16.0)
Immature Granulocytes: 0 % (ref 0.0–0.5)
Lymphocytes %: 39 % (ref 21–52)
Lymphocytes Absolute: 2.3 10*3/uL (ref 0.9–3.6)
MCH: 29.4 PG (ref 24.0–34.0)
MCHC: 33.4 g/dL (ref 31.0–37.0)
MCV: 88.1 FL (ref 78.0–100.0)
MPV: 10 FL (ref 9.2–11.8)
Monocytes %: 10 % (ref 3–10)
Monocytes Absolute: 0.6 10*3/uL (ref 0.05–1.2)
Neutrophils %: 46 % (ref 40–73)
Neutrophils Absolute: 2.7 10*3/uL (ref 1.8–8.0)
Nucleated RBCs: 0 PER 100 WBC
Platelets: 190 10*3/uL (ref 135–420)
RBC: 4.72 M/uL (ref 4.35–5.65)
RDW: 13 % (ref 11.6–14.5)
WBC: 5.9 10*3/uL (ref 4.6–13.2)
nRBC: 0 10*3/uL (ref 0.00–0.01)

## 2022-04-28 LAB — ETHANOL: Ethanol Lvl: <3 MG/DL (ref 0–3)

## 2022-04-28 LAB — URINE DRUG SCREEN
Amphetamine, Urine: NEGATIVE
Barbiturates, Urine: NEGATIVE
Benzodiazepines, Urine: NEGATIVE
Cocaine, Urine: NEGATIVE
Opiates, Urine: NEGATIVE
PCP, Urine: NEGATIVE
THC, TH-Cannabinol, Urine: POSITIVE — AB

## 2022-04-28 LAB — BASIC METABOLIC PANEL
Anion Gap: 1 mmol/L — ABNORMAL LOW (ref 3.0–18)
BUN: 14 MG/DL (ref 7.0–18)
Bun/Cre Ratio: 14 (ref 12–20)
CO2: 30 mmol/L (ref 21–32)
Calcium: 8.9 MG/DL (ref 8.5–10.1)
Chloride: 110 mmol/L (ref 100–111)
Creatinine: 0.98 MG/DL (ref 0.6–1.3)
Est, Glom Filt Rate: >60 mL/min/{1.73_m2} (ref >60)
Glucose: 94 mg/dL (ref 74–99)
Potassium: 4.2 mmol/L (ref 3.5–5.5)
Sodium: 141 mmol/L (ref 136–145)

## 2022-04-28 LAB — COVID-19 & INFLUENZA COMBO
Rapid Influenza A By PCR: NOT DETECTED
Rapid Influenza B By PCR: NOT DETECTED
SARS-CoV-2, PCR: NOT DETECTED

## 2022-04-28 MED ORDER — ARIPIPRAZOLE 5 MG PO TABS
5 MG | Freq: Once | ORAL | Status: AC
Start: 2022-04-28 — End: 2022-04-28
  Administered 2022-04-28: 19:00:00 10 mg via ORAL

## 2022-04-28 MED ORDER — ARIPIPRAZOLE 10 MG PO TABS
10 MG | ORAL_TABLET | Freq: Every day | ORAL | 0 refills | Status: AC
Start: 2022-04-28 — End: ?

## 2022-04-28 MED FILL — ARIPIPRAZOLE 5 MG PO TABS: 5 MG | ORAL | Qty: 2
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
## 2022-04-28 NOTE — ED Notes (Signed)
Pt resting in bed with eyes closed. No distress noted. Frequent rounding. Care ongoing.     Herby Abraham, RN  04/28/22 [phone]

## 2022-04-28 NOTE — Suicide Safety Plan (Signed)
SAFETY PLAN    A suicide Safety Plan is a document that supports someone when they are having thoughts of suicide.    Warning Signs that indicate a suicidal crisis may be developing: What (situations, thoughts, feelings, body sensations, behaviors, etc.) do you experience that lets you know you are beginning to think about suicide?  1. Get headaches  2.   3.     Internal Coping Strategies:  What things can I do (relaxation techniques, hobbies, physical activities, etc.) to take my mind off my problems without contacting another person?  1. Smoke a cigarette  2.   3.     People and social settings that provide distraction: Who can I call or where can I go to distract me?  1. Name: states nobody  Phone:   2. Name:   Phone:    3. Place:             4. Place:     People whom I can ask for help: Who can I call when I need help - for example, friends, family, clergy, someone else?  1. Name:   states nobody              Phone:   2. Name:   Phone:   3. Name:   Phone:     Professionals or [place] I can contact during a crisis: Who can I call for help - for example, my doctor, my psychiatrist, my psychologist, a mental health provider, a suicide hotline?  1. Clinician Name: Dr. Odetta Pink   Phone: [phone]      Clinician Pager or Emergency Contact #: [phone]    2. Clinician Name:    Phone:       Clinician Pager or Emergency Contact #:     3. Suicide Prevention Lifeline: 1-800-273-TALK (8255)    4. [place] [facility] -  for example, [place], local county suicide hotline, [address] [place] [address] Hotline: [place]      Emergency Services Address: [address]   [place], [place]. [address]      Emergency Services Phone: [phone]    Making the environment safe: How can I make my environment (house/apartment/living space) safer? For example, can I remove guns, medications, and other items?  1. States residence is safe as he is not suicidal  2.

## 2022-04-28 NOTE — ED Triage Notes (Signed)
Patient presents to the ED for a mental health evaluation. [name] is a 26 yo male with a mental disorder of Schizophrenia. Patient states that he takes Abilify injection once a month and his next schedule dose is tomorrow. Patient states that he started hearing voices approximately around 7:30 this morning. Patient states that the voices are simply screaming his name over and over. Pt denies the voices telling him to harm himself or anyone else. Pt also denies SI/HI.

## 2022-04-28 NOTE — ED Provider Notes (Signed)
EMERGENCY DEPARTMENT HISTORY AND PHYSICAL EXAM      Date: 04/28/2022  Patient Name: Harry Nelson    History of Presenting Illness     Chief Complaint   Patient presents with    Mental Health Problem       History (Context): Harry Nelson is a 26 y.o. male with significant past medical history of schizophrenia presents ambulatory the ED today.  Patient reports he is to his Abilify injection tomorrow.  Patient states he receives the medication once a month.  Patient reports he developed auditory hallucinations this morning and the voices have been screaming his name over and over.  Denies SI and HI.  Denies alcohol and admits to marijuana use.     PCP: No primary care provider on file.    No current facility-administered medications for this encounter.     Current Outpatient Medications   Medication Sig Dispense Refill    ARIPiprazole (ABILIFY) 10 MG tablet Take 1 tablet by mouth daily 1 tablet 0       Past History     Past Medical History:   No past medical history on file.    Past Surgical History:  No past surgical history on file.    Family History:  Family History   Problem Relation Age of Onset    No Known Problems Sister     No Known Problems Father     No Known Problems Mother     No Known Problems Maternal Aunt     No Known Problems Sister     No Known Problems Brother        Social History:   Social History     Tobacco Use    Smoking status: Never    Smokeless tobacco: Never   Substance Use Topics    Alcohol use: No    Drug use: No       Allergies:  No Known Allergies    PMH, PSH, family history, social history, allergies reviewed with the patient with significant items noted above.  Review of Systems   Review of Systems   Constitutional:  Negative for appetite change and fatigue.   HENT:  Negative for rhinorrhea.    Respiratory:  Negative for shortness of breath.    Cardiovascular:  Negative for chest pain.   Gastrointestinal:  Negative for abdominal pain, diarrhea and vomiting.   Genitourinary:  Negative  for dysuria and penile discharge.   Musculoskeletal:  Negative for arthralgias and myalgias.   Skin:  Negative for color change and wound.   Neurological:  Negative for dizziness and light-headedness.   Psychiatric/Behavioral:  Positive for hallucinations. Negative for suicidal ideas.    All other systems reviewed and are negative.      Physical Exam     Vitals:    04/28/22 0827   BP: (!) 164/105   Pulse: 58   Resp: 17   Temp: 98.6 F (37 C)   TempSrc: Oral   SpO2: 97%   Weight: (!) 162.8 kg (359 lb)   Height: 1.626 m (5\' 4" )       Physical Exam  Vitals and nursing note reviewed.   Constitutional:       Appearance: Normal appearance.      Comments: Pt in NAD   HENT:      Head: Normocephalic and atraumatic.   Eyes:      Conjunctiva/sclera: Conjunctivae normal.   Cardiovascular:      Rate and Rhythm: Normal rate and regular rhythm.  Pulmonary:      Effort: Pulmonary effort is normal.      Breath sounds: Normal breath sounds.   Abdominal:      General: Bowel sounds are normal.      Palpations: Abdomen is soft.   Musculoskeletal:      Cervical back: Full passive range of motion without pain.   Skin:     General: Skin is warm and moist.   Neurological:      Mental Status: He is alert.   Psychiatric:         Thought Content: Thought content normal. Thought content does not include homicidal or suicidal ideation.      Comments: Blunted affect  Calm when talking  Linear thoughts         Diagnostic Study Results     Labs -   No results found for this or any previous visit (from the past 12 hour(s)).   Labs Reviewed   BASIC METABOLIC PANEL - Abnormal; Notable for the following components:       Result Value    Anion Gap 1 (*)     All other components within normal limits   URINE DRUG SCREEN - Abnormal; Notable for the following components:    THC, TH-Cannabinol, Urine Positive (*)     Methadone, Urine Unable to test due to reagent availability (*)     All other components within normal limits   COVID-19 & INFLUENZA COMBO    CBC WITH AUTO DIFFERENTIAL   ETHANOL       Radiologic Studies -   No orders to display         The laboratory results, imaging results, and other diagnostic exams were reviewed in the EMR.    Medical Decision Making   I am the first provider for this patient.    I reviewed the vital signs, available nursing notes, past medical history, past surgical history, family history and social history.    Vital Signs-Reviewed the patient's vital signs.       Records Reviewed: Personally, on initial evaluation    MDM:   DDX includes but is not limited to: Auditory hallucinations, Psychosis    Will obtain lab work and imaging for further evaluation of patients complaint. Will continue to monitor and evaluate patient while in the ED.      Orders as below:  Orders Placed This Encounter   Procedures    COVID-19 & Influenza Combo    CBC with Auto Differential    Basic Metabolic Panel    Urine Drug Screen    Ethanol    IP CONSULT TO [facility]        Harry Nelson presents with complaint of auditory hallucinations that began this morning.  History of schizophrenia and deferred Abilify shot tomorrow.  Denies SI and HI.  Advised by crisis and determined that he does not meet criteria for admission.  Will give Abilify while in ED and discharged home with one tab for tomorrow per psychiatrist.  At time of discharge, pt non-toxic appearing in NAD. Pt stable for prompt outpatient follow-up with PCP 1 to 2 days.  Patient given strict instructions to return if symptoms worsen.      ED Course:   ED Course as of 05/01/22 1143   Sun Apr 28, 2022   1425 Crisis evaluated patient and determine patient does not meet criteria for admission. Patient states auditory hallucinations have decreased. Psychiatrist recommends 10  mg of Abilify now and 10 mg  tomorrow. Patient also has Abilify shot scheduled for tomorrow.  [ET]      ED Course User Index  [ET] Joaquim Lai, PA           Procedures:  Procedures         Documentation/Prior Results Review:   Old medical records.  Nursing notes.        Diagnosis and Disposition     CLINICAL IMPRESSION:  1. Auditory hallucination         Medication List        START taking these medications      ARIPiprazole 10 MG tablet  Commonly known as: Abilify  Take 1 tablet by mouth daily               Where to Get Your Medications        These medications were sent to [facility] [address] [place] - [address] [place] [phone] - [phone]  [address] [place] [address]      Phone: [phone]   ARIPiprazole 10 MG tablet         Disposition: Discharged    Patient condition at time of disposition: Stable    DISCHARGE NOTE:   Pt has been reexamined. Patient has no new complaints, changes, or physical findings.  Care plan outlined and precautions discussed.  Results were reviewed with the patient. All medications were reviewed with the patient. All of pt's questions and concerns were addressed.  Alarm symptoms and return precautions associated with chief complaint and evaluation were reviewed with the patient in detail.  The patient demonstrated adequate understanding.  Patient was instructed to follow up with PCP, as well as given strict return precautions to the ED upon further deterioration. Patient is ready for discharge home.          Dragon Disclaimer     Please note that this dictation was completed with Dragon, the [employer] account planner.  Quite often unanticipated grammatical, syntax, homophones, and other interpretive errors are inadvertently transcribed by the computer software.  Please disregard these errors.  Please excuse any errors that have escaped final proofreading.      [address] PA-C       Joaquim Lai, [place]  05/01/22 1313
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
